# Patient Record
Sex: Female | Born: 1990 | Race: Black or African American | Hispanic: No | Marital: Single | Smoking: Never smoker
Health system: Southern US, Community
[De-identification: ages and names within clinical notes are randomized; demographics above are authoritative.]

## PROBLEM LIST (undated history)

## (undated) DIAGNOSIS — D696 Thrombocytopenia, unspecified: Secondary | ICD-10-CM

## (undated) DIAGNOSIS — O99119 Other diseases of the blood and blood-forming organs and certain disorders involving the immune mechanism complicating pregnancy, unspecified trimester: Secondary | ICD-10-CM

## (undated) DIAGNOSIS — K8020 Calculus of gallbladder without cholecystitis without obstruction: Secondary | ICD-10-CM

## (undated) DIAGNOSIS — Z789 Other specified health status: Secondary | ICD-10-CM

## (undated) HISTORY — DX: Thrombocytopenia, unspecified: O99.119

## (undated) HISTORY — DX: Thrombocytopenia, unspecified: D69.6

## (undated) HISTORY — PX: WISDOM TOOTH EXTRACTION: SHX21

## (undated) HISTORY — PX: INDUCED ABORTION: SHX677

---
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
## 2013-07-29 NOTE — L&D Delivery Note (Signed)
Delivery Note At 7:22 PM a healthy female was delivered via Vaginal, Spontaneous Delivery (Presentation: Left Occiput Anterior).  APGAR: 7, 8; weight .   Placenta status: Intact, Spontaneous.  Cord: 3 vessels with the following complications: None.  Cord pH: na  Anesthesia: None  Episiotomy: None Lacerations: 2nd degree Suture Repair: 3.0 monocyrl Est. Blood Loss (300mL): 200  Upon arrival patient was complete and pushing. She pushed with good maternal effort to deliver a healthy boy. After delivery of the head a tense nuchal cord x 1 was noted. The tension was manually released and baby delivered without difficulty and nuchal cord reduced post delivery. Due to presumed prematurity, NICU was present at delivery. Cord was clamped upon delivery and baby taken to warmer for evaluation, drying and stimulation. Baby received narcan to reverse Fentanyl given to mother prior to delivery. Placenta delivered intact with 3V cord. Pitocin was started and uterus massaged until bleeding slowed. Vaginal canal and perineum was inspected and 2nd degree laceration was found and repaired in the usual fashion by Jacklyn ShellFrances Cresenzo-Dishmon which was hemostatic on completion. Counts of sharps, instruments, and lap pads were all correct.    Mom to postpartum.  Baby to Couplet care / Skin to Skin.  Wenda LowJoyner, James 10/14/2013, 7:52 PM   I was present for delivery and agree with above. CRESENZO-DISHMAN,Brittanee Ghazarian

## 2013-10-14 ENCOUNTER — Encounter (HOSPITAL_COMMUNITY): Payer: Self-pay | Admitting: *Deleted

## 2013-10-14 ENCOUNTER — Inpatient Hospital Stay (HOSPITAL_COMMUNITY): Payer: Medicaid Other

## 2013-10-14 ENCOUNTER — Inpatient Hospital Stay (HOSPITAL_COMMUNITY)
Admission: AD | Admit: 2013-10-14 | Discharge: 2013-10-16 | DRG: 775 | Disposition: A | Discharge status: Home / Self Care | Payer: Medicaid Other | Source: Ambulatory Visit | Attending: Obstetrics & Gynecology | Admitting: Obstetrics & Gynecology

## 2013-10-14 DIAGNOSIS — D689 Coagulation defect, unspecified: Secondary | ICD-10-CM | POA: Diagnosis present

## 2013-10-14 DIAGNOSIS — O9912 Other diseases of the blood and blood-forming organs and certain disorders involving the immune mechanism complicating childbirth: Secondary | ICD-10-CM

## 2013-10-14 DIAGNOSIS — D696 Thrombocytopenia, unspecified: Secondary | ICD-10-CM

## 2013-10-14 DIAGNOSIS — O99892 Other specified diseases and conditions complicating childbirth: Secondary | ICD-10-CM | POA: Diagnosis present

## 2013-10-14 DIAGNOSIS — Z3490 Encounter for supervision of normal pregnancy, unspecified, unspecified trimester: Secondary | ICD-10-CM

## 2013-10-14 DIAGNOSIS — O360990 Maternal care for other rhesus isoimmunization, unspecified trimester, not applicable or unspecified: Secondary | ICD-10-CM | POA: Diagnosis present

## 2013-10-14 DIAGNOSIS — O9989 Other specified diseases and conditions complicating pregnancy, childbirth and the puerperium: Secondary | ICD-10-CM

## 2013-10-14 DIAGNOSIS — Z22330 Carrier of Group B streptococcus: Secondary | ICD-10-CM

## 2013-10-14 HISTORY — DX: Other specified health status: Z78.9

## 2013-10-14 LAB — URINE MICROSCOPIC-ADD ON

## 2013-10-14 LAB — CBC
HCT: 42.1 % (ref 36.0–46.0)
HCT: 40.1 % (ref 36.0–46.0)
Hemoglobin: 14.3 g/dL (ref 12.0–15.0)
Hemoglobin: 13.4 g/dL (ref 12.0–15.0)
MCH: 28.5 pg (ref 26.0–34.0)
MCH: 28 pg (ref 26.0–34.0)
MCHC: 34 g/dL (ref 30.0–36.0)
MCHC: 33.4 g/dL (ref 30.0–36.0)
MCV: 83.9 fL (ref 78.0–100.0)
MCV: 83.7 fL (ref 78.0–100.0)
Platelets: 78 10*3/uL — ABNORMAL LOW (ref 150–400)
Platelets: 68 10*3/uL — ABNORMAL LOW (ref 150–400)
RBC: 5.02 MIL/uL (ref 3.87–5.11)
RBC: 4.79 MIL/uL (ref 3.87–5.11)
RDW: 14.6 % (ref 11.5–15.5)
RDW: 14.5 % (ref 11.5–15.5)
WBC: 8.2 10*3/uL (ref 4.0–10.5)
WBC: 10.9 10*3/uL — ABNORMAL HIGH (ref 4.0–10.5)

## 2013-10-14 LAB — OB RESULTS CONSOLE GBS: STREP GROUP B AG: POSITIVE

## 2013-10-14 LAB — RAPID URINE DRUG SCREEN, HOSP PERFORMED
Amphetamines: NOT DETECTED
Barbiturates: NOT DETECTED
Benzodiazepines: NOT DETECTED
COCAINE: NOT DETECTED
Opiates: NOT DETECTED
TETRAHYDROCANNABINOL: NOT DETECTED

## 2013-10-14 LAB — URINALYSIS, ROUTINE W REFLEX MICROSCOPIC
Bilirubin Urine: NEGATIVE
GLUCOSE, UA: 250 mg/dL — AB
HGB URINE DIPSTICK: NEGATIVE
Ketones, ur: NEGATIVE mg/dL
LEUKOCYTES UA: NEGATIVE
Nitrite: NEGATIVE
PH: 6 (ref 5.0–8.0)
Protein, ur: 30 mg/dL — AB
Specific Gravity, Urine: >1.03 — ABNORMAL HIGH (ref 1.005–1.030)
Urobilinogen, UA: 1 mg/dL (ref 0.0–1.0)

## 2013-10-14 LAB — HEPATITIS B SURFACE ANTIGEN: Hepatitis B Surface Ag: NEGATIVE

## 2013-10-14 LAB — ABO/RH: ABO/RH(D): B NEG

## 2013-10-14 LAB — TYPE AND SCREEN
ABO/RH(D): B NEG
ANTIBODY SCREEN: NEGATIVE

## 2013-10-14 LAB — RPR: RPR: NONREACTIVE

## 2013-10-14 LAB — HEMOGLOBIN A1C
HEMOGLOBIN A1C: 5.6 % (ref <5.7)
Mean Plasma Glucose: 114 mg/dL (ref <117)

## 2013-10-14 LAB — GLUCOSE, CAPILLARY: GLUCOSE-CAPILLARY: 95 mg/dL (ref 70–99)

## 2013-10-14 LAB — OB RESULTS CONSOLE HIV ANTIBODY (ROUTINE TESTING): HIV: NONREACTIVE

## 2013-10-14 LAB — GROUP B STREP BY PCR: GROUP B STREP BY PCR: POSITIVE — AB

## 2013-10-14 LAB — RAPID HIV SCREEN (WH-MAU): Rapid HIV Screen: NONREACTIVE

## 2013-10-14 MED ORDER — EPHEDRINE 5 MG/ML INJ
10.0000 mg | INTRAVENOUS | Status: DC | PRN
  Filled 2013-10-14: qty 2

## 2013-10-14 MED ORDER — OXYCODONE-ACETAMINOPHEN 5-325 MG PO TABS
1.0000 | ORAL_TABLET | ORAL | Status: DC | PRN
  Administered 2013-10-15 (×3): 1 via ORAL
  Administered 2013-10-16: 2 via ORAL
  Filled 2013-10-14 (×6): qty 1

## 2013-10-14 MED ORDER — PHENYLEPHRINE 40 MCG/ML (10ML) SYRINGE FOR IV PUSH (FOR BLOOD PRESSURE SUPPORT)
80.0000 ug | PREFILLED_SYRINGE | INTRAVENOUS | Status: DC | PRN
  Filled 2013-10-14: qty 2

## 2013-10-14 MED ORDER — IBUPROFEN 600 MG PO TABS: 600.0000 mg | ORAL_TABLET | Freq: Four times a day (QID) | ORAL | Status: DC

## 2013-10-14 MED ORDER — DIPHENHYDRAMINE HCL 25 MG PO CAPS: 25.0000 mg | ORAL_CAPSULE | Freq: Four times a day (QID) | ORAL | Status: DC | PRN

## 2013-10-14 MED ORDER — CITRIC ACID-SODIUM CITRATE 334-500 MG/5ML PO SOLN: 30.0000 mL | ORAL | Status: DC | PRN

## 2013-10-14 MED ORDER — TETANUS-DIPHTH-ACELL PERTUSSIS 5-2.5-18.5 LF-MCG/0.5 IM SUSP
0.5000 mL | Freq: Once | INTRAMUSCULAR | Status: AC
  Administered 2013-10-16: 0.5 mL via INTRAMUSCULAR
  Filled 2013-10-14: qty 0.5

## 2013-10-14 MED ORDER — PRENATAL MULTIVITAMIN CH
1.0000 | ORAL_TABLET | Freq: Every day | ORAL | Status: DC
Start: 2013-10-15 — End: 2013-10-16
  Administered 2013-10-15 – 2013-10-16 (×2): 1 via ORAL
  Filled 2013-10-14 (×2): qty 1

## 2013-10-14 MED ORDER — ZOLPIDEM TARTRATE 5 MG PO TABS
5.0000 mg | ORAL_TABLET | Freq: Every evening | ORAL | Status: DC | PRN
Start: 2013-10-14 — End: 2013-10-16

## 2013-10-14 MED ORDER — LACTATED RINGERS IV SOLN: 500.0000 mL | Freq: Once | INTRAVENOUS | Status: DC

## 2013-10-14 MED ORDER — SENNOSIDES-DOCUSATE SODIUM 8.6-50 MG PO TABS
2.0000 | ORAL_TABLET | ORAL | Status: DC
  Administered 2013-10-15 – 2013-10-16 (×2): 2 via ORAL
  Filled 2013-10-14 (×2): qty 2

## 2013-10-14 MED ORDER — NALOXONE HCL 0.4 MG/ML IJ SOLN
INTRAMUSCULAR | Status: AC
  Administered 2013-10-14: 0.4 mg
  Filled 2013-10-14: qty 1

## 2013-10-14 MED ORDER — OXYCODONE-ACETAMINOPHEN 5-325 MG PO TABS: 1.0000 | ORAL_TABLET | ORAL | Status: DC | PRN

## 2013-10-14 MED ORDER — PHENYLEPHRINE 40 MCG/ML (10ML) SYRINGE FOR IV PUSH (FOR BLOOD PRESSURE SUPPORT)
80.0000 ug | PREFILLED_SYRINGE | INTRAVENOUS | Status: DC | PRN
  Filled 2013-10-14: qty 10
  Filled 2013-10-14: qty 2

## 2013-10-14 MED ORDER — SODIUM CHLORIDE 0.9 % IV SOLN: 2.0000 g | Freq: Four times a day (QID) | INTRAVENOUS | Status: DC

## 2013-10-14 MED ORDER — DIBUCAINE 1 % RE OINT
1.0000 | TOPICAL_OINTMENT | RECTAL | Status: DC | PRN
Start: 2013-10-14 — End: 2013-10-16

## 2013-10-14 MED ORDER — EPHEDRINE 5 MG/ML INJ
10.0000 mg | INTRAVENOUS | Status: DC | PRN
  Filled 2013-10-14: qty 4
  Filled 2013-10-14: qty 2

## 2013-10-14 MED ORDER — FENTANYL 2.5 MCG/ML BUPIVACAINE 1/10 % EPIDURAL INFUSION (WH - ANES)
14.0000 mL/h | INTRAMUSCULAR | Status: DC | PRN
  Filled 2013-10-14: qty 125

## 2013-10-14 MED ORDER — LACTATED RINGERS IV SOLN
INTRAVENOUS | Status: DC
  Administered 2013-10-14: 14:00:00 via INTRAVENOUS

## 2013-10-14 MED ORDER — ACETAMINOPHEN 325 MG PO TABS: 650.0000 mg | ORAL_TABLET | ORAL | Status: DC | PRN

## 2013-10-14 MED ORDER — DIPHENHYDRAMINE HCL 50 MG/ML IJ SOLN: 12.5000 mg | INTRAMUSCULAR | Status: DC | PRN

## 2013-10-14 MED ORDER — ONDANSETRON HCL 4 MG/2ML IJ SOLN
4.0000 mg | Freq: Four times a day (QID) | INTRAMUSCULAR | Status: DC | PRN
  Administered 2013-10-14: 4 mg via INTRAVENOUS
  Filled 2013-10-14: qty 2

## 2013-10-14 MED ORDER — FENTANYL CITRATE 0.05 MG/ML IJ SOLN
INTRAMUSCULAR | Status: AC
  Administered 2013-10-14: 100 ug via INTRAVENOUS
  Filled 2013-10-14: qty 2

## 2013-10-14 MED ORDER — ONDANSETRON HCL 4 MG/2ML IJ SOLN: 4.0000 mg | INTRAMUSCULAR | Status: DC | PRN

## 2013-10-14 MED ORDER — BENZOCAINE-MENTHOL 20-0.5 % EX AERO
1.0000 "application " | INHALATION_SPRAY | CUTANEOUS | Status: DC | PRN
  Administered 2013-10-15: 1 via TOPICAL
  Filled 2013-10-14: qty 56

## 2013-10-14 MED ORDER — LANOLIN HYDROUS EX OINT: TOPICAL_OINTMENT | CUTANEOUS | Status: DC | PRN

## 2013-10-14 MED ORDER — OXYTOCIN BOLUS FROM INFUSION: 500.0000 mL | INTRAVENOUS | Status: DC

## 2013-10-14 MED ORDER — WITCH HAZEL-GLYCERIN EX PADS: 1.0000 "application " | MEDICATED_PAD | CUTANEOUS | Status: DC | PRN

## 2013-10-14 MED ORDER — FENTANYL CITRATE 0.05 MG/ML IJ SOLN
100.0000 ug | INTRAMUSCULAR | Status: DC | PRN
Start: 2013-10-14 — End: 2013-10-14
  Administered 2013-10-14 (×3): 100 ug via INTRAVENOUS
  Filled 2013-10-14 (×2): qty 2

## 2013-10-14 MED ORDER — ONDANSETRON HCL 4 MG PO TABS: 4.0000 mg | ORAL_TABLET | ORAL | Status: DC | PRN

## 2013-10-14 MED ORDER — FLEET ENEMA 7-19 GM/118ML RE ENEM: 1.0000 | ENEMA | RECTAL | Status: DC | PRN

## 2013-10-14 MED ORDER — IBUPROFEN 600 MG PO TABS: 600.0000 mg | ORAL_TABLET | Freq: Four times a day (QID) | ORAL | Status: DC | PRN

## 2013-10-14 MED ORDER — SODIUM CHLORIDE 0.9 % IV SOLN
2.0000 g | Freq: Four times a day (QID) | INTRAVENOUS | Status: DC
  Administered 2013-10-14: 2 g via INTRAVENOUS
  Filled 2013-10-14 (×2): qty 2000

## 2013-10-14 MED ORDER — SIMETHICONE 80 MG PO CHEW
80.0000 mg | CHEWABLE_TABLET | ORAL | Status: DC | PRN
Start: 2013-10-14 — End: 2013-10-16

## 2013-10-14 MED ORDER — OXYTOCIN 40 UNITS IN LACTATED RINGERS INFUSION - SIMPLE MED
62.5000 mL/h | INTRAVENOUS | Status: DC
  Filled 2013-10-14: qty 1000

## 2013-10-14 MED ORDER — LACTATED RINGERS IV SOLN: 500.0000 mL | INTRAVENOUS | Status: DC | PRN

## 2013-10-14 MED ORDER — LIDOCAINE HCL (PF) 1 % IJ SOLN
30.0000 mL | INTRAMUSCULAR | Status: DC | PRN
  Administered 2013-10-14: 30 mL via SUBCUTANEOUS
  Filled 2013-10-14: qty 30

## 2013-10-14 NOTE — Consult Note (Signed)
Neonatology Note:  Attendance at Delivery:  I was asked by Frances Cresenzo-Dishmon for Dr. Arnold to attend this NSVD at 34-[redacted] weeks GA by ultrasound today and no PNC. The mother is a G2PoA1 B neg, GBS pos with thrombocytopenia of unknown etiology (platelet count 68-78K today). She had not received Rhogam. She got Ampicillin 3 hours before delivery and was afebrile. ROM 45 min prior to delivery, fluid clear. I bulb suctioned the baby while the cord was being clamped and he coughed and cried once, then became quiet again. He was placed on the radiant warmer at about 1 min of life and had slightly decreased tone and reflexes, dusky color, HR 120. We bulb suctioned again and gave stimulation. He cried several times and began to pink up. At about 2 min of life, he became apneic for about 20 seconds, maintaining his HR. We gave stimulation again and obtained a history of Fentanyl administration to his mother during labor. I asked his mother about any illicit drug use during pregnancy, which she denied. I gave the baby 1 ml Narcan to the right anterior thigh at about 6-7 min of age, after which he became much more vigorous, pinking up well, crying intermittently, and generally looking more vigorous. Ap 7/8/9. Lungs clear to ausc in DR. Baby appears 37-[redacted] weeks GA by exam, so may go to CN to care of Pediatrician.  Karen Ormond C. Marciana Uplinger, MD  

## 2013-10-14 NOTE — MAU Note (Signed)
Pt reports she has started having ctx this morning. No bleeding or leaking reported.

## 2013-10-14 NOTE — Progress Notes (Signed)
   Karen [name] is a 23 y.o. G2P0010 at 4276w2d  admitted for active labor  Subjective: Doing pretty good with epidural  Objective: BP 135/66  Pulse 126  Temp(Src) 98.9 F (37.2 C) (Oral)  Resp 18  Ht 5\' 4"  (1.626 m)  Wt 157 lb (71.215 kg)  BMI 26.94 kg/m2    FHT:  FHR: 150 bpm, variability: moderate,  accelerations:  Present,  decelerations:  Absent UC:   regular, every 2-3 minutes SVE:   Dilation: Lip/rim Effacement (%): 100 Station: 0;+1 Exam by:: creszeno-dishmon, cnm Forebag ruptured with clear fluid  Labs: Lab Results  Component Value Date   WBC 10.9* 10/14/2013   HGB 13.4 10/14/2013   HCT 40.1 10/14/2013   MCV 83.7 10/14/2013   PLT 68* 10/14/2013    Assessment / Plan: Spontaneous labor, progressing normally Thrombocytopenia of unknown etiology Labor: Progressing normally Fetal Wellbeing:  Category I Pain Control:  Fentanyl Anticipated MOD:  NSVD  CRESENZO-DISHMAN,Marykathleen Russi 10/14/2013, 6:26 PM

## 2013-10-14 NOTE — H&P (Signed)
LABOR ADMISSION HISTORY AND PHYSICAL   Karen Glassingaris S Tamm is a 23 y.o. female G2P0010 with IUP at 4062w2d by US today presenting for active labor. States contractions started this AM and have progressively worsened.  No LOF, vb. +FM.   Pt has had no PNC. Was seen at the HD for a confirmation of pregnancy test but never had any visits. States did not have medicaid.    Prenatal History/Complications:  Past Medical History: Past Medical History  Diagnosis Date  . Medical history non-contributory     Past Surgical History: Past Surgical History  Procedure Laterality Date  . Wisdom tooth extraction      Obstetrical History: OB History   Grav Para Term Preterm Abortions TAB SAB Ect Mult Living   2 0 0 0 1 1 0 0 0 0       Social History: History   Social History  . Marital Status: Single    Spouse Name: N/A    Number of Children: N/A  . Years of Education: N/A   Social History Main Topics  . Smoking status: Never Smoker   . Smokeless tobacco: None  . Alcohol Use: No  . Drug Use: No  . Sexual Activity: Yes   Other Topics Concern  . None   Social History Narrative  . None    Family History: History reviewed. No pertinent family history.  Allergies: No Known Allergies  Prescriptions prior to admission  Medication Sig Dispense Refill  . Prenatal Vit-Fe Fumarate-FA (MULTIVITAMIN-PRENATAL) 27-0.8 MG TABS tablet Take 1 tablet by mouth daily at 12 noon.         Review of Systems  All systems reviewed and negative except as stated in HPI    Blood pressure 122/69, pulse 108, temperature 98 F (36.7 C), temperature source Oral, resp. rate 20, height 5\' 4"  (1.626 m), weight 71.215 kg (157 lb). General appearance: alert, cooperative and no distress Lungs: clear to auscultation bilaterally Heart: regular rate and rhythm Abdomen: soft, non-tender; bowel sounds normal Extremities: Homans sign is negative, no sign of DVT DTR's 3+, no clonus Presentation: cephalic Fetal  monitoringBaseline: 145 bpm, Variability: Good {> 6 bpm), Accelerations: Reactive and Decelerations: Absent Uterine activity initially q472min and then irregular after fentanyl    Dilation: 5 Effacement (%): 100 Station: 0 Exam by:: hk   Prenatal labs: ABO, Rh: --/--/B NEG (03/19 1335) Antibody: PENDING (03/19 1335) Rubella:   RPR:    HBsAg:    HIV:    GBS: Positive (03/19 0000)  Anatomy US normal limited anatomy     Assessment: Karen [name] is a 23 y.o. G2P0010 with an IUP at 1362w2d by an US today presenting for active preterm labor.   Plan: 1) Active Preterm labor  - changed cervix from 4-5  - admit to L&D  - routine orders - avoid augmentation   2) FWB - US today with limited anatomy but likely 162w2d.  - NICU notified - GBS pos and will give amp  3) RH neg - no rhogam - will need postpartum if no antibodies  4) thrombocytopenia - unclear etiology  - no epidural currently - will repeat in a few hours  5) anticipate NSVD    Carron Jaggi L, MD 10/14/2013, 4:00 PM

## 2013-10-15 LAB — CBC
HCT: 35 % — ABNORMAL LOW (ref 36.0–46.0)
Hemoglobin: 11.6 g/dL — ABNORMAL LOW (ref 12.0–15.0)
MCH: 28 pg (ref 26.0–34.0)
MCHC: 33.1 g/dL (ref 30.0–36.0)
MCV: 84.3 fL (ref 78.0–100.0)
Platelets: 70 10*3/uL — ABNORMAL LOW (ref 150–400)
RBC: 4.15 MIL/uL (ref 3.87–5.11)
RDW: 14.7 % (ref 11.5–15.5)
WBC: 14 10*3/uL — ABNORMAL HIGH (ref 4.0–10.5)

## 2013-10-15 LAB — RUBELLA SCREEN: Rubella: 4.15 Index — ABNORMAL HIGH (ref <0.90)

## 2013-10-15 LAB — GC/CHLAMYDIA PROBE AMP
CT PROBE, AMP APTIMA: NEGATIVE
GC PROBE AMP APTIMA: NEGATIVE

## 2013-10-15 MED ORDER — RHO D IMMUNE GLOBULIN 1500 UNIT/2ML IJ SOLN
300.0000 ug | Freq: Once | INTRAMUSCULAR | Status: AC
  Administered 2013-10-15: 300 ug via INTRAVENOUS
  Filled 2013-10-15: qty 2

## 2013-10-15 NOTE — Lactation Note (Signed)
This note was copied from the chart of [name]. Lactation Consultation Note Initial consultation; mom states she wants to breast and formula feed baby; states wants to exclusively br feed for now. Mom states br feeding is going ok now, denies pain, states she is still getting used to how to hold baby, states baby latches well, denies pain. Mom states she is using a nipple shield which helps baby's latch. Mom states she fed baby about 30 minutes ago, at this time baby is asleep on his back in the bassinet.  Br feeding basics, baby and me book reviewed with mom, questions answered. Lactation brochure, community resources, BFSG discussed with mom.  Enc mom to call for next feeding for a feed assessment.  Patient Name: [name] ZOXWR'UToday's Date: 10/15/2013 Reason for consult: Initial assessment   Maternal Data Formula Feeding for Exclusion: Yes Reason for exclusion: Mother's choice to formula and breast feed on admission Has patient been taught Hand Expression?: Yes Does the patient have breastfeeding experience prior to this delivery?: No  Feeding    LATCH Score/Interventions                      Lactation Tools Discussed/Used     Consult Status Consult Status: Follow-up    [name], [name] [facility] 10/15/2013, 12:00 PM

## 2013-10-15 NOTE — Progress Notes (Signed)
Post Partum Day 1 Subjective: no complaints, up ad lib, voiding and tolerating PO  Objective: Blood pressure 110/68, pulse 110, temperature 98.1 F (36.7 C), temperature source Oral, resp. rate 18, height 5\' 4"  (1.626 m), weight 71.215 kg (157 lb), SpO2 98.00%, unknown if currently breastfeeding.  Physical Exam:  General: alert and cooperative Lochia: appropriate DVT Evaluation: No evidence of DVT seen on physical exam. Uterine Fundus: firm  Recent Labs  10/14/13 1722 10/15/13 0628  HGB 13.4 11.6*  HCT 40.1 35.0*    Assessment/Plan: Plan for discharge tomorrow. Lab values reveal that the patient is still having stable thrombocytopenia in the 70's, however she remains asymptomatic. Monitor and repeat lab values in the AM.   LOS: 1 day   Unknown FoleyWinkel, Bradley T 10/15/2013, 8:02 AM   I have seen and examined this patient and agree with above documentation in the PA student's note.   Rulon AbideKeli Godson Pollan, M.D. [facility] 10/15/2013 11:31 AM

## 2013-10-15 NOTE — Clinical Social Work Maternal (Signed)
Clinical Social Work Department  PSYCHOSOCIAL ASSESSMENT - MATERNAL/CHILD  10/15/2013  Patient: [name],[name] Account Number: [id number] Admit Date: 10/14/2013  Childs Name:  Karen [name]   Clinical Social Worker: Karen Balan, LCSW Date/Time: 10/15/2013 12:40 PM  Date Referred: 10/15/2013  Referral source   CN    Referred reason   Other - See comment   Other referral source:  I: FAMILY / HOME ENVIRONMENT  Child's legal guardian: PARENT  Guardian - Name  Guardian - Age  Guardian - Address   Karen [name]  22  [address] [place], [place] [address]   Karen [name]  27    Other household support members/support persons  Name  Relationship  DOB   Karen [name]  FATHER     SISTER  16 years old   Other support:  II PSYCHOSOCIAL DATA  Information Source: Patient Interview  Financial and Community Resources  Employment:  Financial resources: Self Pay  If Medicaid - County:  School / Grade:  Maternity Care Coordinator / Child Services Coordination / Early Interventions: Cultural issues impacting care:  III STRENGTHS  Strengths   Adequate Resources   Home prepared for Child (including basic supplies)   Supportive family/friends   Strength comment:  IV RISK FACTORS AND CURRENT PROBLEMS  Current Problem: YES  Risk Factor & Current Problem  Patient Issue  Family Issue  Risk Factor / Current Problem Comment   Other - See comment  Y  N  NPNC   V SOCIAL WORK ASSESSMENT  CSW referral received to assess pts reason for NPNC. Pt told CSW that she [facility] at [facility] at 6 months however PN records not on file. Since pt told CSW that she started PNC at 6 months, CSW inquired about the reason for LPNC. Pt initially was not able to verbalized a reason but then stated " I was in denial." She seems to be bonding well with the infant now & committed to parenting. CSW explained hospital drug testing policy & pt verbalized an understanding. UDS is negative, meconium results are pending.  Pt is not employed & does not receive any services from department of social services. CSW inquired how pt would provide financial support for infant. FOB spoke up & said " I will help out a lot with the baby." CSW encouraged pt to apply for WIC & Food Stamps. CSW provided pt with a Food Stamp applications as a resource. Pt has all the necessary supplies for the infant. FOB is present & identified himself as a primary support person. Pt lives with her grandfather & teenage sibling. Her support system appears limited. Pt was observed providing appropriate care for the infant & attending to infant cues appropriately. CSW discussed PP depression signs/symptoms with pt & encouraged her to seek medical attention if necessary. CSW will continue to monitor drug screen results & make a referral if needed.   VI SOCIAL WORK PLAN  Social Work Plan   No Further Intervention Required / No Barriers to Discharge   Type of pt/family education:  If child protective services report - county:  If child protective services report - date:  Information/referral to community resources comment:  Other social work plan:       Clinical Social Work Department PSYCHOSOCIAL ASSESSMENT - MATERNAL/CHILD 10/15/2013  Patient:  Karen [name], Karen [name]  Account Number:  [id number]  Admit Date:  10/14/2013  Karen [name] Name:   Karen [name]    Clinical Social Worker:  Karen Putnam, LCSW   Date/Time:  10/15/2013 12:40 PM  Date Referred:  10/15/2013   Referral source  CN     Referred reason  Other - See comment   Other referral source:    I:  FAMILY / HOME ENVIRONMENT Child's legal guardian:  PARENT  Guardian - Name Guardian - Age Guardian - Address  Karen [name] 22 [address]; [place], [place] [address]  Karen [name] 27    Other household support members/support persons Name Relationship DOB  Karen [name] FATHER    SISTER 60 years old   Other support:    II  PSYCHOSOCIAL DATA Information Source:  Patient Interview  Surveyor, quantity and Community Resources Employment:   Financial resources:  Self Pay If [employer] - County:    School / Grade:   Maternity Care Coordinator / Child Services Coordination / Early Interventions:  Cultural issues impacting care:    III  STRENGTHS Strengths  Adequate Resources  Home prepared for Child (including basic supplies)  Supportive family/friends   Strength comment:    IV  RISK FACTORS AND CURRENT PROBLEMS Current Problem:  YES   Risk Factor & Current Problem Patient Issue Family Issue Risk Factor / Current Problem Comment  Other - See comment Y N NPNC    V  SOCIAL WORK ASSESSMENT CSW referral received to assess pts reason for [facility].  Pt told CSW that she started [facility] at [facility] at 6 months however PN records not on file.  Since pt told CSW that she started [facility] at 6 months, CSW inquired about the reason for [facility].   Pt initially was not able to verbalized a reason but then stated " I was in denial."  She seems to be bonding well with the infant now & committed to parenting. CSW explained hospital drug testing policy & pt verbalized an understanding.  UDS is  negative, meconium results are pending.  Pt is not employed & does not receive any services from department of social services.  CSW inquired how pt would provide financial support for infant.  FOB spoke up & said " I will help out a lot with the baby." CSW encouraged pt to apply for [facility] & Food Stamps.  CSW provided pt with a Food Stamp applications as a resource. Pt has all the necessary supplies for the infant.  FOB is present & identified himself as a primary support person. Pt lives with her grandfather & teenage sibling.  Her support system appears limited.  Pt was observed providing appropriate care for the infant & attending to infant cues appropriately.  CSW discussed PP depression signs/symptoms with pt & encouraged her to seek medical attention if necessary.  CSW will continue to monitor drug screen results & make a referral if needed.      VI SOCIAL WORK PLAN Social Work Plan  No Further Intervention Required / No Barriers to Discharge   Type of pt/family education:   If child protective services report - county:   If child protective services report - date:   Information/referral to community resources comment:   Other social work plan:

## 2013-10-16 LAB — RH IG WORKUP (INCLUDES ABO/RH)
ABO/RH(D): B NEG
FETAL SCREEN: NEGATIVE
Gestational Age(Wks): 38
Unit division: 0

## 2013-10-16 MED ORDER — IBUPROFEN 200 MG PO TABS: 600.0000 mg | ORAL_TABLET | Freq: Four times a day (QID) | ORAL | Status: DC | PRN

## 2013-10-16 NOTE — Discharge Instructions (Signed)

## 2013-10-16 NOTE — Discharge Summary (Signed)
Obstetric Discharge Summary Reason for Admission: onset of labor Prenatal Procedures: none Intrapartum Procedures: spontaneous vaginal delivery and GBS prophylaxis Postpartum Procedures: none Complications-Operative and Postpartum: 2nd degree perineal laceration Hemoglobin  Date Value Ref Range Status  10/15/2013 11.6* 12.0 - 15.0 g/dL Final     HCT  Date Value Ref Range Status  10/15/2013 35.0* 36.0 - 46.0 % Final   Hospital Course Karen [name] DoctorS Jarnagin is a 23 y.o. G2P0010 who presented to the MAU in active labor at 3839w2d, by third trimester ultrasound on admission.  She received no PNC except for a confirmation of pregnancy test at HD. Pt states that she didn't seek care because she didn't have medicaid. One dose of ampicillin was given for positive GBS status. The patient delivered a healthy female via NSVD with apgars of  7 and 8. The infant had a nuchal cord that was easily reduced after delivery. The infant was treated as premature based on the admission ultrasound, but further assessment dated infant to 37-38 weeks.  Baby received narcan to reverse Fentanyl given to mother prior to delivery. Mother had a 2nd degree laceration which was repaired. Postpartum course has been uncomplicated.  Mother is bottle feeding and plans to get Nexplanon.    Physical Exam:  General: alert, cooperative and no distress Lochia: appropriate Uterine Fundus: firm Incision: N/A DVT Evaluation: No evidence of DVT seen on physical exam. Negative Homan's sign.  Discharge Diagnoses: Term Pregnancy-delivered  Discharge Information: Date: 10/16/2013 Activity: unrestricted Diet: routine Medications: PNV, Ibuprofen and Percocet Condition: stable Instructions: refer to practice specific booklet Discharge to: home   Newborn Data: Live born female  Birth Weight: 7 lb 12.5 oz (3530 g) APGAR: 7, 8  Home with mother.  Selena LesserBraimah, Tina 10/16/2013, 6:48 AM  I have seen and examined this patient and I agree with the  above. Cam HaiSHAW, Aleyda Gindlesperger CNM 8:34 AM 10/16/2013

## 2013-10-17 NOTE — Discharge Summary (Signed)
`````  Attestation of Attending Supervision of Advanced Practitioner: Evaluation and management procedures were performed by the PA/NP/CNM/OB Fellow under my supervision/collaboration. Chart reviewed and agree with management and plan.  Temperance Kelemen V 10/17/2013 11:11 PM

## 2013-10-19 NOTE — Progress Notes (Signed)
Post discharge chart review completed.  

## 2013-12-21 ENCOUNTER — Ambulatory Visit: Payer: Self-pay | Admitting: Obstetrics

## 2013-12-30 ENCOUNTER — Ambulatory Visit (INDEPENDENT_AMBULATORY_CARE_PROVIDER_SITE_OTHER): Payer: Medicaid Other | Admitting: Obstetrics

## 2013-12-30 ENCOUNTER — Encounter: Payer: Self-pay | Admitting: Obstetrics

## 2013-12-30 DIAGNOSIS — Z3009 Encounter for other general counseling and advice on contraception: Secondary | ICD-10-CM

## 2013-12-30 MED ORDER — MEDROXYPROGESTERONE ACETATE 150 MG/ML IM SUSP: 150.0000 mg | INTRAMUSCULAR | Status: DC

## 2013-12-30 NOTE — Progress Notes (Signed)
Subjective:     Karen [name] is a 23 y.o. female who presents for a postpartum visit. She is 6 weeks postpartum following a spontaneous vaginal delivery. I have fully reviewed the prenatal and intrapartum course. The delivery was at term gestational weeks. Outcome: spontaneous vaginal delivery. Anesthesia: IV sedation. Postpartum course has been normal. Baby's course has been normal. Baby is feeding by breast. Bleeding no bleeding. Bowel function is normal. Bladder function is normal. Patient is sexually active. Contraception method is Depo-Provera injections. Postpartum depression screening: negative.  The following portions of the patient's history were reviewed and updated as appropriate: allergies, current medications, past family history, past medical history, past social history, past surgical history and problem list.  Review of Systems A comprehensive review of systems was negative.   Objective:    BP 128/82  Pulse 90  Temp(Src) 97.7 F (36.5 C)  Ht 5\' 5"  (1.651 m)  Wt 131 lb (59.421 kg)  BMI 21.80 kg/m2  Breastfeeding? Yes  General:  alert and no distress   Breasts:  inspection negative, no nipple discharge or bleeding, no masses or nodularity palpable  Lungs: clear to auscultation bilaterally  Heart:  regular rate and rhythm, S1, S2 normal, no murmur, click, rub or gallop  Abdomen: normal findings: soft, non-tender   Vulva:  normal  Vagina: normal vagina  Cervix:  no lesions  Corpus: normal size, contour, position, consistency, mobility, non-tender  Adnexa:  no mass, fullness, tenderness  Rectal Exam: Not performed.          Assessment:     Normal postpartum exam. Pap smear not done at today's visit.   Plan:    1. Contraception: Depo-Provera injections 2. Depo Provera Rx. 3. Follow up in: 1 year or as needed.

## 2014-01-06 ENCOUNTER — Ambulatory Visit (INDEPENDENT_AMBULATORY_CARE_PROVIDER_SITE_OTHER): Payer: Medicaid Other | Admitting: *Deleted

## 2014-01-06 VITALS — BP 112/66 | HR 71 | Temp 99.0°F | Ht 65.0 in | Wt 131.0 lb

## 2014-01-06 DIAGNOSIS — IMO0001 Reserved for inherently not codable concepts without codable children: Secondary | ICD-10-CM

## 2014-01-06 DIAGNOSIS — Z309 Encounter for contraceptive management, unspecified: Secondary | ICD-10-CM

## 2014-01-06 LAB — POCT URINE PREGNANCY: Preg Test, Ur: NEGATIVE

## 2014-01-06 MED ORDER — MEDROXYPROGESTERONE ACETATE 150 MG/ML IM SUSP
150.0000 mg | INTRAMUSCULAR | Status: AC
  Administered 2014-01-06 – 2014-09-22 (×4): 150 mg via INTRAMUSCULAR

## 2014-01-06 NOTE — Progress Notes (Signed)
Patient in office for a Depo injection.  Patient states she is sexually active and her last intercourse was about 2 weeks ago. Per Dr. Clearance Coots okay to give with a negative pregnancy test. Pregnancy test performed in office. Results were negative.  Patient given injection and tolerated well. Patient to return to office for next injection 03-30-14.   BP 112/66  Pulse 71  Temp(Src) 99 F (37.2 C)  Ht 5\' 5"  (1.651 m)  Wt 131 lb (59.421 kg)  BMI 21.80 kg/m2  Breastfeeding? Yes  Administrations This Visit   medroxyPROGESTERone (DEPO-PROVERA) injection 150 mg   Administered Action Dose Route Administered By   01/06/2014 Given 150 mg Intramuscular Shelda Pal, LPN

## 2014-03-25 ENCOUNTER — Ambulatory Visit: Payer: Medicaid Other | Admitting: Obstetrics

## 2014-03-30 ENCOUNTER — Ambulatory Visit (INDEPENDENT_AMBULATORY_CARE_PROVIDER_SITE_OTHER): Payer: Medicaid Other | Admitting: *Deleted

## 2014-03-30 ENCOUNTER — Ambulatory Visit: Payer: Medicaid Other

## 2014-03-30 VITALS — BP 125/75 | HR 96 | Temp 98.7°F | Wt 124.0 lb

## 2014-03-30 DIAGNOSIS — Z3049 Encounter for surveillance of other contraceptives: Secondary | ICD-10-CM

## 2014-03-30 DIAGNOSIS — Z3042 Encounter for surveillance of injectable contraceptive: Secondary | ICD-10-CM

## 2014-03-30 NOTE — Progress Notes (Signed)
Patient is in the office today for her DEPO Injection. Patient is on time for her Injection. Injection given in Left Deltoid. Patient tolerated well. Patient denies any concerns today. Patient notified to make an appointment with the front for June 21, 2014 for her next injection. Patient voiced understanding.   BP 125/75  Pulse 96  Temp(Src) 98.7 F (37.1 C)  Wt 124 lb (56.246 kg)  LMP 03/03/2014  Breastfeeding? Yes  Administrations This Visit   medroxyPROGESTERone (DEPO-PROVERA) injection 150 mg   Administered Action Dose Route Administered By   03/30/2014 Given 150 mg Intramuscular Odessa Fleming, LPN

## 2014-06-21 ENCOUNTER — Ambulatory Visit: Payer: Medicaid Other

## 2014-06-29 ENCOUNTER — Ambulatory Visit (INDEPENDENT_AMBULATORY_CARE_PROVIDER_SITE_OTHER): Payer: Medicaid Other | Admitting: *Deleted

## 2014-06-29 VITALS — BP 118/77 | HR 105 | Temp 97.8°F | Wt 130.0 lb

## 2014-06-29 DIAGNOSIS — Z3042 Encounter for surveillance of injectable contraceptive: Secondary | ICD-10-CM

## 2014-06-29 NOTE — Progress Notes (Signed)
Pt is in office today for Depo injection.  Pt is on time for injection.  Pt tolerated injection well.  Pt advised to schedule appt for next injection anytime between 09/14/14-09/29/14.    BP 118/77 mmHg  Pulse 105  Temp(Src) 97.8 F (36.6 C)  Wt 130 lb (58.968 kg)  Administrations This Visit    medroxyPROGESTERone (DEPO-PROVERA) injection 150 mg    Administered Action Dose Route Administered By         06/29/2014 Given 150 mg Intramuscular Lanney GinsSuzanne D Foster, CMA

## 2014-09-14 ENCOUNTER — Ambulatory Visit: Payer: Medicaid Other

## 2014-09-16 ENCOUNTER — Ambulatory Visit: Payer: Medicaid Other

## 2014-09-22 ENCOUNTER — Ambulatory Visit: Payer: Medicaid Other | Admitting: *Deleted

## 2014-09-22 VITALS — BP 129/79 | HR 93 | Temp 99.0°F | Ht 64.0 in | Wt 129.0 lb

## 2014-09-22 DIAGNOSIS — Z3042 Encounter for surveillance of injectable contraceptive: Secondary | ICD-10-CM

## 2014-09-22 NOTE — Progress Notes (Signed)
Patient in office today for a Depo Injection. Patient is on time for her injection.  Patient tolerated injection well.  Patient due for next injection on 12-15-14. Patient is also due for an annual exam. Patient advised to schedule that appointment on her way out.   BP 129/79 mmHg  Pulse 93  Temp(Src) 99 F (37.2 C)  Ht 5\' 4"  (1.626 m)  Wt 129 lb (58.514 kg)  BMI 22.13 kg/m2  Breastfeeding? Yes   Administrations This Visit    medroxyPROGESTERone (DEPO-PROVERA) injection 150 mg    Admin Date Action Dose Route Administered By         09/22/2014 Given 150 mg Intramuscular Shelda PalAndrea Lynn Jaleiyah Alas, LPN

## 2014-12-15 ENCOUNTER — Ambulatory Visit: Payer: Medicaid Other

## 2014-12-18 IMAGING — US US OB COMP +14 WK
1 series · 12 of 22 positions shown · non-contrast
Comparison: none

[Series 1: us ob comp +14 wk · 12 of 22 slices shown]
[im 1/22]
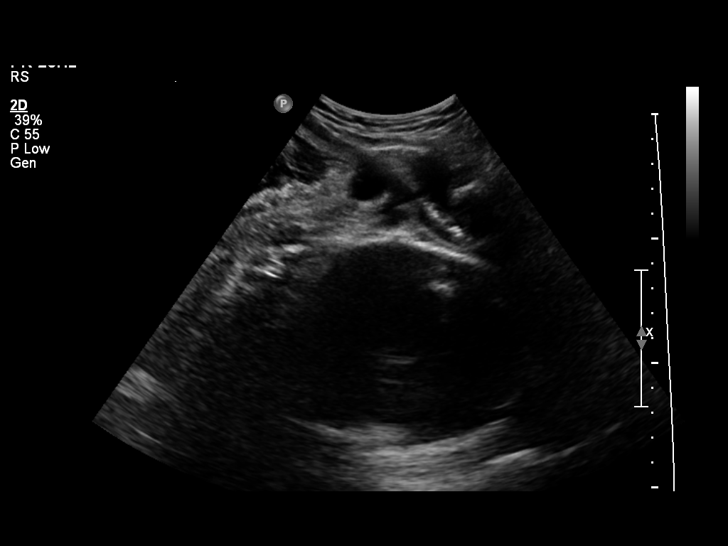
[im 3/22]
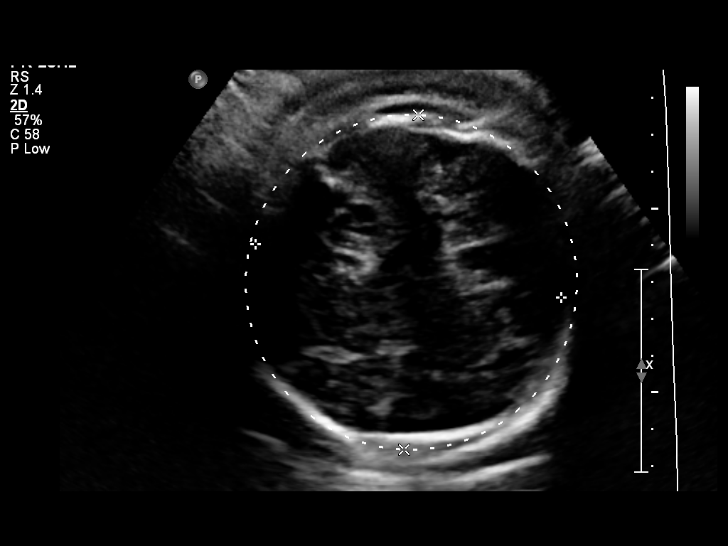
[im 5/22]
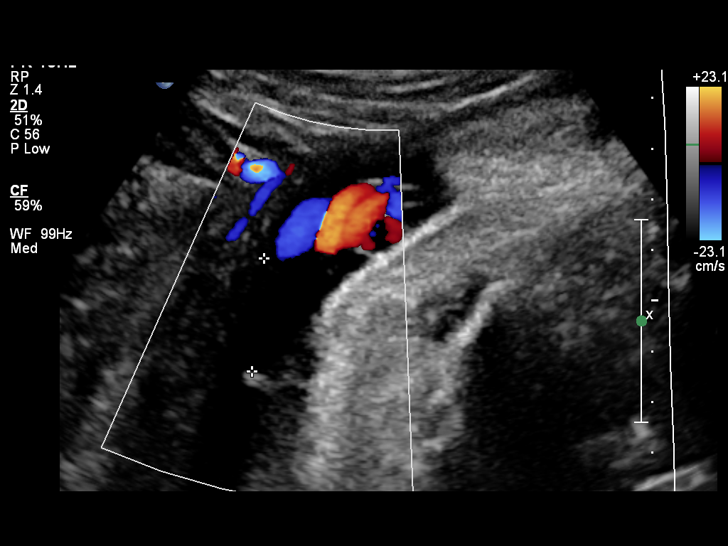
[im 7/22]
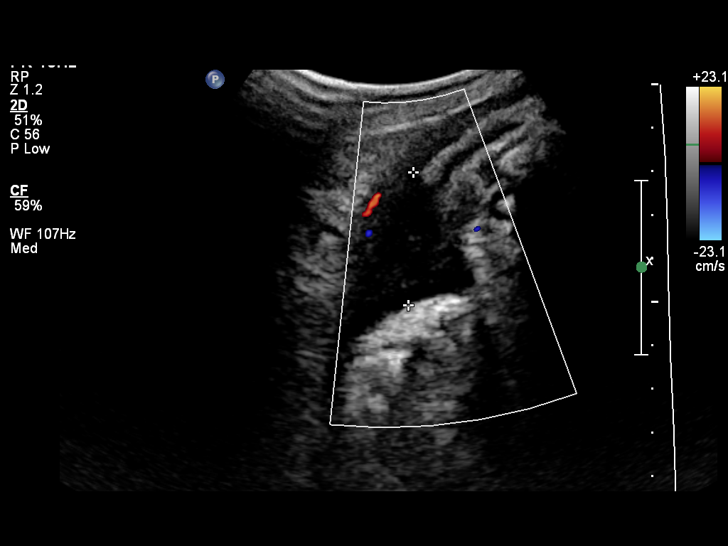
[im 9/22]
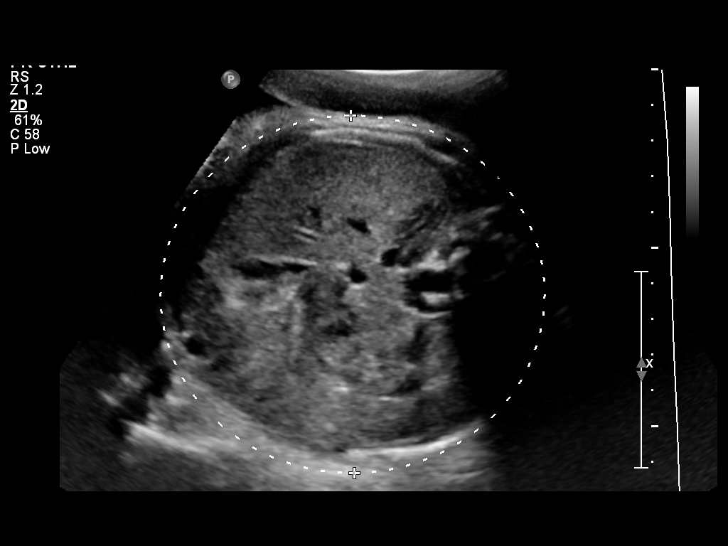
[im 11/22]
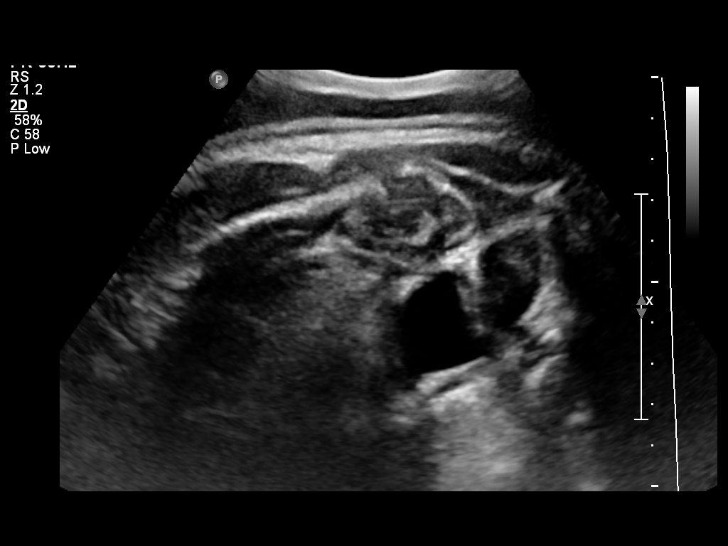
[im 12/22]
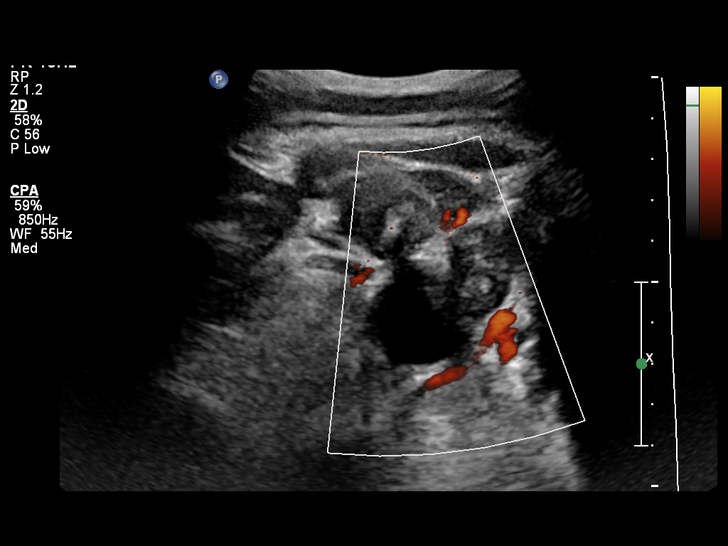
[im 14/22]
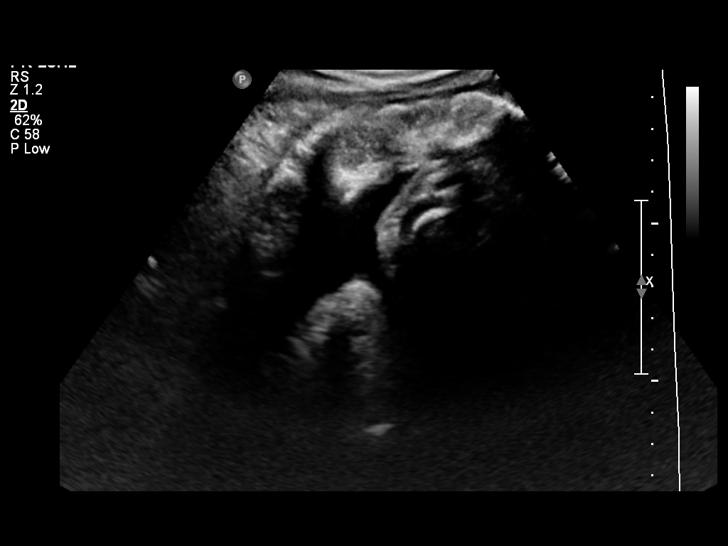
[im 16/22]
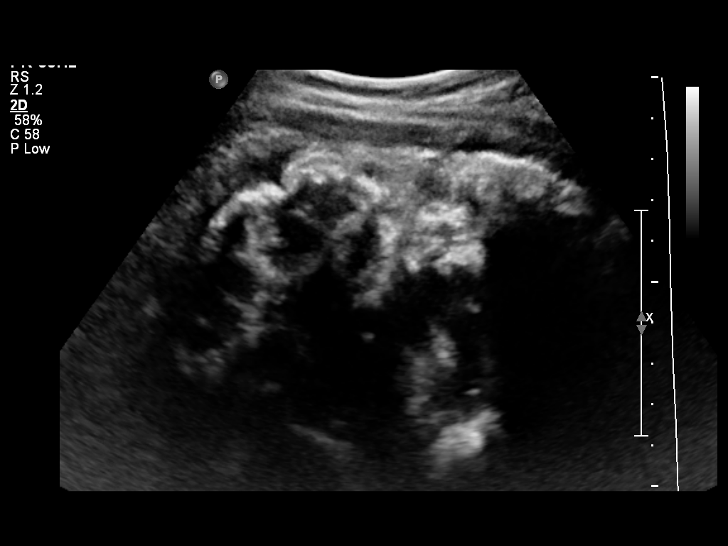
[im 18/22]
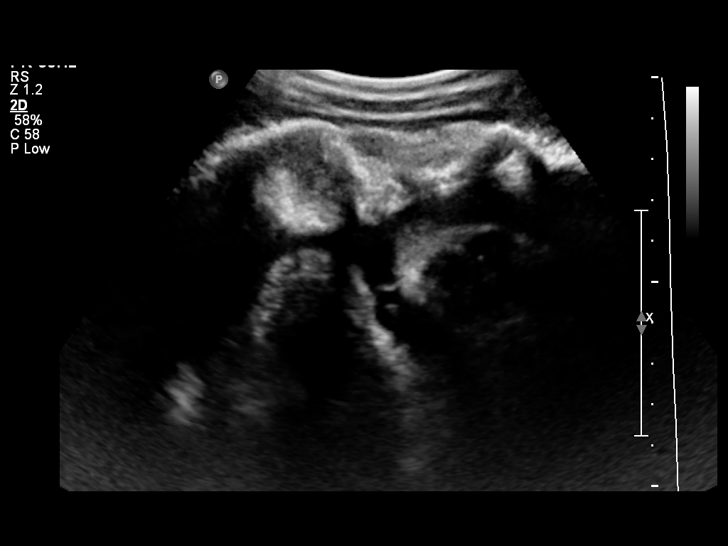
[im 20/22]
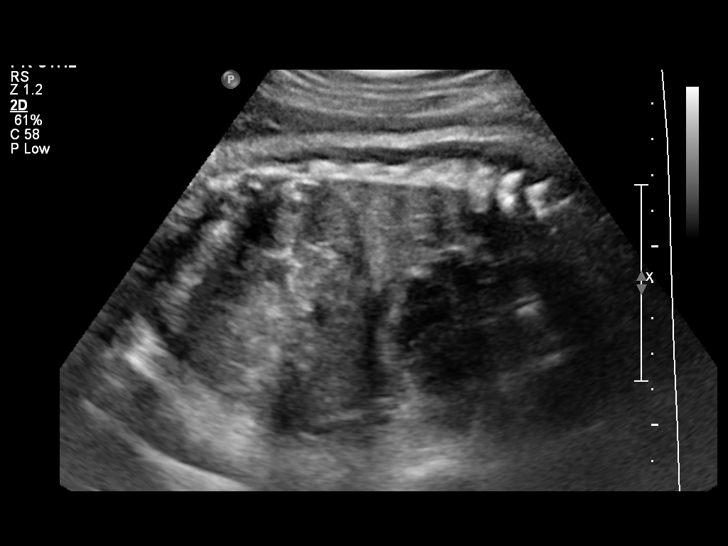
[im 22/22]
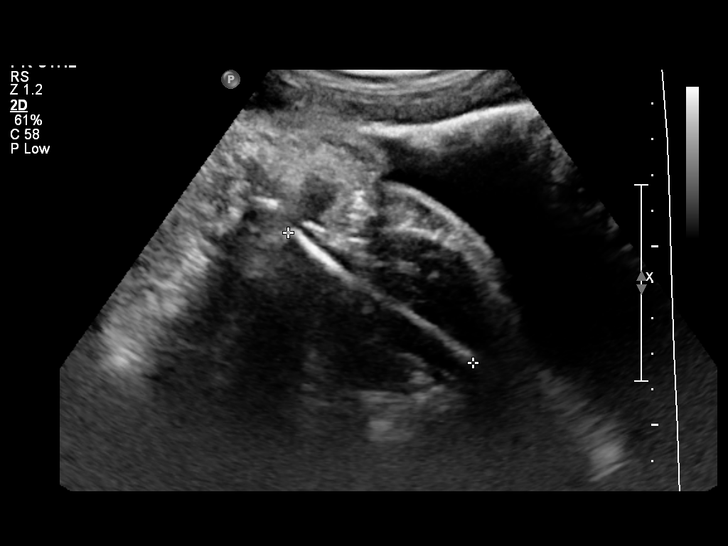

[12 of 22 positions shown; findings below may reference images not displayed]

OBSTETRICS REPORT
                      (Signed Final 10/14/2013 [DATE])

Service(s) Provided

 US OB COMP + 14 WK                                    76805.1
Indications

 Unsure of LMP;  Establish Gestational [AGE]
 No or Little Prenatal Care
 Active preterm labor, fetus 1
Fetal Evaluation

 Num Of Fetuses:    1
 Fetal Heart Rate:  146                          bpm
 Cardiac Activity:  Observed
 Presentation:      Cephalic
 Placenta:          Posterior Fundal, above
                    cervical os

 Amniotic Fluid
 AFI FV:      Subjectively within normal limits
 AFI Sum:     9.1     cm       13  %Tile     Larg Pckt:    3.04  cm
 RUQ:   3.04    cm   RLQ:    1.72   cm    LUQ:   2.11    cm   LLQ:    2.23   cm
Biometry

 BPD:     86.7  mm     G. Age:  35w 0d                CI:        86.22   70 - 86
                                                      FL/HC:      21.5   19.4 -

 HC:       294  mm     G. Age:  32w 4d      < 3  %    HC/AC:      0.89   0.96 -

 AC:     330.1  mm     G. Age:  36w 6d     > 97  %    FL/BPD:     72.8   71 - 87
 FL:      63.1  mm     G. Age:  32w 5d        9  %    FL/AC:      19.1   20 - 24

 Est. FW:    7020  gm    5 lb 12 oz      76  %
Gestational Age

 U/S Today:     34w 2d                                        EDD:   11/23/13
 Best:          34w 2d     Det. By:  U/S (10/14/13)           EDD:   11/23/13
Anatomy

 Cranium:          Appears normal         Aortic Arch:      Not well visualized
 Fetal Cavum:      Not well visualized    Ductal Arch:      Not well visualized
 Ventricles:       Not well visualized    Diaphragm:        Appears normal
 Choroid Plexus:   Not well visualized    Stomach:          Not well visualized
 Cerebellum:       Not well visualized    Abdomen:          Not well visualized
 Posterior Fossa:  Not well visualized    Abdominal Wall:   Not well visualized
 Nuchal Fold:      Not well visualized    Cord Vessels:     Not well visualized
 Face:             Profile appears        Kidneys:          Not well visualized
                   normal
 Lips:             Not well visualized    Bladder:          Appears normal
 Heart:            Not well visualized    Spine:            Not well visualized
 RVOT:             Not well visualized    Lower             Not well visualized
                                          Extremities:
 LVOT:             Not well visualized    Upper             Not well visualized
                                          Extremities:

 Other:  Technically difficult due to advanced GA and fetal position. Patient in
         active labor and contracting q 2 mins. Patient asked me to stop the
         exam early, she was too uncomfortable to continue.
Cervix Uterus Adnexa

 Cervix:       Not visualized (advanced GA >93wks)
Impression

 Single IUP at 34 [DATE] weeks
 Late onset of care
 Limited views of the fetal anatomy obtained due to late
 gestational age
 No gross anomalies noted
 Estimated fetal weight at the 76th %tile.  The AC measures >
 97th %tile
 Normal amniotic fluid volume
Recommendations

 Recommend follow-up ultrasound examination in 3-4 weeks
 for interval growth given large AC.

 questions or concerns.

## 2016-03-19 ENCOUNTER — Ambulatory Visit: Payer: Medicaid Other | Admitting: Obstetrics

## 2016-04-16 ENCOUNTER — Ambulatory Visit: Payer: Medicaid Other | Admitting: Obstetrics

## 2016-10-20 ENCOUNTER — Encounter (HOSPITAL_COMMUNITY): Payer: Self-pay

## 2016-10-20 ENCOUNTER — Emergency Department (HOSPITAL_COMMUNITY): Payer: Medicaid Other

## 2016-10-20 ENCOUNTER — Emergency Department (HOSPITAL_COMMUNITY)
Admission: EM | Admit: 2016-10-20 | Discharge: 2016-10-20 | Disposition: A | Discharge status: Home / Self Care | Payer: Medicaid Other | Attending: Emergency Medicine | Admitting: Emergency Medicine

## 2016-10-20 DIAGNOSIS — K8050 Calculus of bile duct without cholangitis or cholecystitis without obstruction: Secondary | ICD-10-CM

## 2016-10-20 DIAGNOSIS — R109 Unspecified abdominal pain: Secondary | ICD-10-CM | POA: Diagnosis present

## 2016-10-20 DIAGNOSIS — K8020 Calculus of gallbladder without cholecystitis without obstruction: Secondary | ICD-10-CM | POA: Insufficient documentation

## 2016-10-20 LAB — COMPREHENSIVE METABOLIC PANEL
ALT: 72 U/L — ABNORMAL HIGH (ref 14–54)
AST: 186 U/L — AB (ref 15–41)
Albumin: 4 g/dL (ref 3.5–5.0)
Alkaline Phosphatase: 62 U/L (ref 38–126)
Anion gap: 9 (ref 5–15)
BUN: 10 mg/dL (ref 6–20)
CO2: 24 mmol/L (ref 22–32)
Calcium: 9.2 mg/dL (ref 8.9–10.3)
Chloride: 106 mmol/L (ref 101–111)
Creatinine, Ser: 0.81 mg/dL (ref 0.44–1.00)
GFR calc Af Amer: >60 mL/min (ref >60)
GFR calc non Af Amer: >60 mL/min (ref >60)
GLUCOSE: 133 mg/dL — AB (ref 65–99)
Potassium: 3.5 mmol/L (ref 3.5–5.1)
SODIUM: 139 mmol/L (ref 135–145)
TOTAL PROTEIN: 7.1 g/dL (ref 6.5–8.1)
Total Bilirubin: 1 mg/dL (ref 0.3–1.2)

## 2016-10-20 LAB — CBC
HEMATOCRIT: 41.3 % (ref 36.0–46.0)
Hemoglobin: 13.4 g/dL (ref 12.0–15.0)
MCH: 27.4 pg (ref 26.0–34.0)
MCHC: 32.4 g/dL (ref 30.0–36.0)
MCV: 84.5 fL (ref 78.0–100.0)
Platelets: 175 10*3/uL (ref 150–400)
RBC: 4.89 MIL/uL (ref 3.87–5.11)
RDW: 12.3 % (ref 11.5–15.5)
WBC: 8.8 10*3/uL (ref 4.0–10.5)

## 2016-10-20 LAB — URINALYSIS, ROUTINE W REFLEX MICROSCOPIC
BILIRUBIN URINE: NEGATIVE
GLUCOSE, UA: NEGATIVE mg/dL
Hgb urine dipstick: NEGATIVE
Ketones, ur: NEGATIVE mg/dL
Nitrite: NEGATIVE
PH: 8 (ref 5.0–8.0)
Protein, ur: 100 mg/dL — AB
Specific Gravity, Urine: 1.027 (ref 1.005–1.030)

## 2016-10-20 LAB — LIPASE, BLOOD: LIPASE: 12 U/L (ref 11–51)

## 2016-10-20 LAB — POC URINE PREG, ED: Preg Test, Ur: NEGATIVE

## 2016-10-20 MED ORDER — SODIUM CHLORIDE 0.9 % IV BOLUS (SEPSIS)
1000.0000 mL | Freq: Once | INTRAVENOUS | Status: AC
Start: 2016-10-20 — End: 2016-10-20
  Administered 2016-10-20: 1000 mL via INTRAVENOUS

## 2016-10-20 MED ORDER — ONDANSETRON HCL 4 MG/2ML IJ SOLN
4.0000 mg | Freq: Once | INTRAMUSCULAR | Status: AC
  Administered 2016-10-20: 4 mg via INTRAVENOUS
  Filled 2016-10-20: qty 2

## 2016-10-20 MED ORDER — OXYCODONE-ACETAMINOPHEN 5-325 MG PO TABS: 1.0000 | ORAL_TABLET | Freq: Three times a day (TID) | ORAL | 0 refills | Status: DC | PRN

## 2016-10-20 MED ORDER — PROMETHAZINE HCL 25 MG PO TABS: 25.0000 mg | ORAL_TABLET | Freq: Four times a day (QID) | ORAL | 0 refills | Status: DC | PRN

## 2016-10-20 NOTE — ED Notes (Signed)
Patient transported to Ultrasound 

## 2016-10-20 NOTE — ED Triage Notes (Signed)
Pt complaining of right lower abdominal pain. Pt complaining of N/V. States 4 episodes of emesis today. Pt also complaining of dysuria.

## 2016-10-20 NOTE — ED Provider Notes (Signed)
[facility]-EMERGENCY DEPT Provider Note   CSN: [id number] Arrival date & time: 10/20/16  0051    History   Chief Complaint Chief Complaint  Patient presents with  . Abdominal Pain    HPI Karen [name] is a 26 y.o. female.  26 year old female since the emergency department for evaluation of abdominal pain. She reports right-sided abdominal pain which began after eating [place] food earlier yesterday afternoon. She had persistent pain despite taking a laxative. She actually reports that this made her symptoms worse. She has had nausea as well as 4 episodes of vomiting prior to arrival. She has been passing flatus and had a more constipated bowel movement yesterday. No associated fevers, dysuria, hematuria, vaginal bleeding, or vaginal discharge. Patient denies a history of abdominal surgeries. She reports that she has had similar pain in the past after eating fatty foods.   The history is provided by the patient. No language interpreter was used.  Abdominal Pain      Past Medical History:  Diagnosis Date  . Medical history non-contributory     Patient Active Problem List   Diagnosis Date Noted  . Pregnancy 10/14/2013    Past Surgical History:  Procedure Laterality Date  . WISDOM TOOTH EXTRACTION      OB History    Gravida Para Term Preterm AB Living   2 1 0 1 1 1    SAB TAB Ectopic Multiple Live Births   0 1 0 0 1       Home Medications    Prior to Admission medications   Medication Sig Start Date End Date Taking? Authorizing Provider  medroxyPROGESTERone (DEPO-PROVERA) 150 MG/ML injection Inject 1 mL (150 mg total) into the muscle every 3 (three) months. 12/30/13  Yes Brock Bad, MD  oxyCODONE-acetaminophen (PERCOCET/ROXICET) 5-325 MG tablet Take 1-2 tablets by mouth every 8 (eight) hours as needed for severe pain. 10/20/16   Antony Madura, PA-C  promethazine (PHENERGAN) 25 MG tablet Take 1 tablet (25 mg total) by mouth every 6 (six) hours as needed for nausea or  vomiting. 10/20/16   Antony Madura, PA-C    Family History History reviewed. No pertinent family history.  Social History Social History  Substance Use Topics  . Smoking status: Never Smoker  . Smokeless tobacco: Never Used  . Alcohol use No     Allergies   Patient has no known allergies.   Review of Systems Review of Systems  Gastrointestinal: Positive for abdominal pain.  Ten systems reviewed and are negative for acute change, except as noted in the HPI.    Physical Exam Updated Vital Signs BP 128/83   Pulse (!) 101   Temp 98.1 F (36.7 C) (Oral)   Resp 16   Ht 5\' 4"  (1.626 m)   Wt 62.3 kg   SpO2 100%   BMI 23.57 kg/m   Physical Exam  Constitutional: She is oriented to person, place, and time. She appears well-developed and well-nourished. No distress.  Nontoxic and in NAD  HENT:  Head: Normocephalic and atraumatic.  Eyes: Conjunctivae and EOM are normal. No scleral icterus.  Neck: Normal range of motion.  Cardiovascular: Normal rate, regular rhythm and intact distal pulses.   Pulmonary/Chest: Effort normal. No respiratory distress. She has no wheezes.  Respirations even and unlabored  Abdominal: Soft. She exhibits no distension. There is no tenderness. There is no guarding.  Soft abdomen. No focal TTP. No peritoneal signs or masses.  Musculoskeletal: Normal range of motion.  Neurological: She is alert  and oriented to person, place, and time. She exhibits normal muscle tone. Coordination normal.  GCS 15. Patient moving all extremities. She ambulates with steady gait.  Skin: Skin is warm and dry. No rash noted. She is not diaphoretic. No erythema. No pallor.  Psychiatric: She has a normal mood and affect. Her behavior is normal.  Nursing note and vitals reviewed.    ED Treatments / Results  Labs (all labs ordered are listed, but only abnormal results are displayed) Labs Reviewed  COMPREHENSIVE METABOLIC PANEL - Abnormal; Notable for the following:        Result Value   Glucose, Bld 133 (*)    AST 186 (*)    ALT 72 (*)    All other components within normal limits  URINALYSIS, ROUTINE W REFLEX MICROSCOPIC - Abnormal; Notable for the following:    Color, Urine AMBER (*)    APPearance CLOUDY (*)    Protein, ur 100 (*)    Leukocytes, UA TRACE (*)    Bacteria, UA RARE (*)    Squamous Epithelial / LPF TOO NUMEROUS TO COUNT (*)    All other components within normal limits  LIPASE, BLOOD  CBC  POC URINE PREG, ED    EKG  EKG Interpretation None       Radiology [place] Abdomen Limited  Result Date: 10/20/2016 CLINICAL DATA:  Initial evaluation for acute right-sided abdominal pain, nausea, vomiting. EXAM: US ABDOMEN LIMITED - RIGHT UPPER QUADRANT COMPARISON:  None. FINDINGS: Gallbladder: Stones and sludge present within the gallbladder lumen. Largest stone measured approximately 5-6 mm. Gallbladder wall measure within normal limits at 2.4 mm. No free pericholecystic fluid. No sonographic Murphy sign elicited on exam. Common bile duct: Diameter: 2.6 mm Liver: No focal lesion identified. Within normal limits in parenchymal echogenicity. IMPRESSION: 1. Stones and sludge within the gallbladder lumen. No other sonographic features to suggest acute cholecystitis. 2. No biliary dilatation. Electronically Signed   By: Rise MuBenjamin  McClintock M.D.   On: 10/20/2016 03:10    Procedures Procedures (including critical care time)  Medications Ordered in ED Medications  ondansetron (ZOFRAN) injection 4 mg (4 mg Intravenous Given 10/20/16 0154)  sodium chloride 0.9 % bolus 1,000 mL (1,000 mLs Intravenous New Bag/Given 10/20/16 0154)     Initial Impression / Assessment and Plan / ED Course  I have reviewed the triage vital signs and the nursing notes.  Pertinent labs & imaging results that were available during my care of the patient were reviewed by me and considered in my medical decision making (see chart for details).     [age] year old female presents to  the emergency department for onset of right-sided abdominal pain which began after eating [place] food. She had nausea and 4 episodes of vomiting. Patient with no complaints of pain on arrival. She had a soft and nontender abdomen. This remains stable on repeat exam. Workup today reveals mild transaminitis. Normal alkaline phosphatase and total bilirubin. No leukocytosis or fever.  Ultrasound reveals gallstones and sludge without. Cholecystic fluid or gallbladder wall thickening. The patient has been monitored for several hours without repeat symptoms. Pain suspected secondary to biliary colic. Plan to refer to general surgery for outpatient follow-up. Have discussed the need for elective cholecystectomy. Patient advised to refrain from fatty or fried foods. Return precautions discussed and provided. Patient discharged in stable condition with no unaddressed concerns.   Final Clinical Impressions(s) / ED Diagnoses   Final diagnoses:  Calculus of gallbladder without cholecystitis without obstruction  Biliary colic  New Prescriptions New Prescriptions   OXYCODONE-ACETAMINOPHEN (PERCOCET/ROXICET) 5-325 MG TABLET    Take 1-2 tablets by mouth every 8 (eight) hours as needed for severe pain.   PROMETHAZINE (PHENERGAN) 25 MG TABLET    Take 1 tablet (25 mg total) by mouth every 6 (six) hours as needed for nausea or vomiting.     Antony Madura, PA-C 10/20/16 0415    Charlynne Pander, MD 10/20/16 [phone]

## 2017-02-09 ENCOUNTER — Encounter (HOSPITAL_COMMUNITY): Payer: Self-pay | Admitting: *Deleted

## 2017-02-09 ENCOUNTER — Emergency Department (HOSPITAL_COMMUNITY)
Admission: EM | Admit: 2017-02-09 | Discharge: 2017-02-09 | Disposition: A | Discharge status: Home / Self Care | Payer: Managed Care, Other (non HMO) | Attending: Emergency Medicine | Admitting: Emergency Medicine

## 2017-02-09 ENCOUNTER — Emergency Department (HOSPITAL_COMMUNITY): Payer: Managed Care, Other (non HMO)

## 2017-02-09 DIAGNOSIS — R112 Nausea with vomiting, unspecified: Secondary | ICD-10-CM | POA: Diagnosis not present

## 2017-02-09 DIAGNOSIS — R740 Nonspecific elevation of levels of transaminase and lactic acid dehydrogenase [LDH]: Secondary | ICD-10-CM | POA: Diagnosis not present

## 2017-02-09 DIAGNOSIS — K8050 Calculus of bile duct without cholangitis or cholecystitis without obstruction: Secondary | ICD-10-CM | POA: Diagnosis not present

## 2017-02-09 DIAGNOSIS — R945 Abnormal results of liver function studies: Secondary | ICD-10-CM | POA: Diagnosis not present

## 2017-02-09 DIAGNOSIS — R17 Unspecified jaundice: Secondary | ICD-10-CM

## 2017-02-09 DIAGNOSIS — R7401 Elevation of levels of liver transaminase levels: Secondary | ICD-10-CM

## 2017-02-09 DIAGNOSIS — R1011 Right upper quadrant pain: Secondary | ICD-10-CM | POA: Diagnosis present

## 2017-02-09 DIAGNOSIS — Z79899 Other long term (current) drug therapy: Secondary | ICD-10-CM | POA: Insufficient documentation

## 2017-02-09 DIAGNOSIS — K8020 Calculus of gallbladder without cholecystitis without obstruction: Secondary | ICD-10-CM | POA: Insufficient documentation

## 2017-02-09 HISTORY — DX: Calculus of gallbladder without cholecystitis without obstruction: K80.20

## 2017-02-09 LAB — URINALYSIS, ROUTINE W REFLEX MICROSCOPIC
BILIRUBIN URINE: NEGATIVE
GLUCOSE, UA: NEGATIVE mg/dL
HGB URINE DIPSTICK: NEGATIVE
KETONES UR: NEGATIVE mg/dL
NITRITE: NEGATIVE
PH: 7 (ref 5.0–8.0)
PROTEIN: NEGATIVE mg/dL
Specific Gravity, Urine: 1.023 (ref 1.005–1.030)

## 2017-02-09 LAB — CBC
HCT: 43.5 % (ref 36.0–46.0)
HEMOGLOBIN: 14 g/dL (ref 12.0–15.0)
MCH: 27.5 pg (ref 26.0–34.0)
MCHC: 32.2 g/dL (ref 30.0–36.0)
MCV: 85.5 fL (ref 78.0–100.0)
PLATELETS: 185 10*3/uL (ref 150–400)
RBC: 5.09 MIL/uL (ref 3.87–5.11)
RDW: 12.2 % (ref 11.5–15.5)
WBC: 5.2 10*3/uL (ref 4.0–10.5)

## 2017-02-09 LAB — COMPREHENSIVE METABOLIC PANEL
ALK PHOS: 68 U/L (ref 38–126)
ALT: 53 U/L (ref 14–54)
ANION GAP: 9 (ref 5–15)
AST: 98 U/L — ABNORMAL HIGH (ref 15–41)
Albumin: 4.2 g/dL (ref 3.5–5.0)
BUN: 9 mg/dL (ref 6–20)
CALCIUM: 9.7 mg/dL (ref 8.9–10.3)
CO2: 26 mmol/L (ref 22–32)
CREATININE: 0.85 mg/dL (ref 0.44–1.00)
Chloride: 102 mmol/L (ref 101–111)
Glucose, Bld: 97 mg/dL (ref 65–99)
Potassium: 3.9 mmol/L (ref 3.5–5.1)
SODIUM: 137 mmol/L (ref 135–145)
Total Bilirubin: 1.3 mg/dL — ABNORMAL HIGH (ref 0.3–1.2)
Total Protein: 7.5 g/dL (ref 6.5–8.1)

## 2017-02-09 LAB — I-STAT BETA HCG BLOOD, ED (MC, WL, AP ONLY): I-stat hCG, quantitative: <5 m[IU]/mL (ref <5)

## 2017-02-09 LAB — LIPASE, BLOOD: LIPASE: 21 U/L (ref 11–51)

## 2017-02-09 MED ORDER — ONDANSETRON HCL 4 MG/2ML IJ SOLN
4.0000 mg | Freq: Once | INTRAMUSCULAR | Status: AC
  Administered 2017-02-09: 4 mg via INTRAVENOUS
  Filled 2017-02-09: qty 2

## 2017-02-09 MED ORDER — ONDANSETRON 4 MG PO TBDP: 4.0000 mg | ORAL_TABLET | Freq: Three times a day (TID) | ORAL | 0 refills | Status: DC | PRN

## 2017-02-09 MED ORDER — FAMOTIDINE IN NACL 20-0.9 MG/50ML-% IV SOLN
20.0000 mg | Freq: Once | INTRAVENOUS | Status: AC
  Administered 2017-02-09: 20 mg via INTRAVENOUS
  Filled 2017-02-09: qty 50

## 2017-02-09 MED ORDER — NAPROXEN 500 MG PO TABS: 500.0000 mg | ORAL_TABLET | Freq: Two times a day (BID) | ORAL | 0 refills | Status: DC | PRN

## 2017-02-09 MED ORDER — HYDROCODONE-ACETAMINOPHEN 5-325 MG PO TABS: 1.0000 | ORAL_TABLET | Freq: Four times a day (QID) | ORAL | 0 refills | Status: DC | PRN

## 2017-02-09 MED ORDER — SODIUM CHLORIDE 0.9 % IV BOLUS (SEPSIS)
1000.0000 mL | Freq: Once | INTRAVENOUS | Status: AC
  Administered 2017-02-09: 1000 mL via INTRAVENOUS

## 2017-02-09 MED ORDER — MORPHINE SULFATE (PF) 4 MG/ML IV SOLN
4.0000 mg | Freq: Once | INTRAVENOUS | Status: AC
  Administered 2017-02-09: 4 mg via INTRAVENOUS
  Filled 2017-02-09: qty 1

## 2017-02-09 NOTE — ED Triage Notes (Signed)
PT states abdominal pain that resembles pain r/t gallstones.  Due for surgery on 7/28, but pain is unbearable.

## 2017-02-09 NOTE — Discharge Instructions (Signed)
Your abdominal pain and symptoms are due to your gallstones. It's very important that you follow up with the surgeon in order to discuss having your gallbladder removed. Avoid spicy/fatty/acidic/fried foods, avoid soda/coffee/tea/alcohol. Avoid laying down flat within 30 minutes of eating. Avoid NSAIDs like ibuprofen/aleve/motrin/naprosyn/etc on an empty stomach. May consider using over the counter tums/maalox/zantac as needed for additional relief. Use zofran as directed as needed for nausea. Use norco as directed as needed for pain but don't drive or operate machinery while taking this medication. Follow up with the surgeon in 5-7 days for recheck of symptoms and ongoing management of your gallbladder issues. Return to the ER for changes or worsening symptoms.  Abdominal (belly) pain can be caused by many things. Your caregiver performed an examination and possibly ordered blood/urine tests and imaging (CT scan, x-rays, ultrasound). Many cases can be observed and treated at home after initial evaluation in the emergency department. Even though you are being discharged home, abdominal pain can be unpredictable. Therefore, you need a repeated exam if your pain does not resolve, returns, or worsens. Most patients with abdominal pain don't have to be admitted to the hospital or have surgery, but serious problems like appendicitis and gallbladder attacks can start out as nonspecific pain. Many abdominal conditions cannot be diagnosed in one visit, so follow-up evaluations are very important. SEEK IMMEDIATE MEDICAL ATTENTION IF YOU DEVELOP ANY OF THE FOLLOWING SYMPTOMS: The pain does not go away or becomes severe.  A temperature above 101 develops.  Repeated vomiting occurs (multiple episodes).  The pain becomes localized to portions of the abdomen. The right side could possibly be appendicitis. In an adult, the left lower portion of the abdomen could be colitis or diverticulitis.  Blood is being passed in  stools or vomit (bright red or black tarry stools).  Return also if you develop chest pain, difficulty breathing, dizziness or fainting, or become confused, poorly responsive, or inconsolable (young children). The constipation stays for more than 4 days.  There is belly (abdominal) or rectal pain.  You do not seem to be getting better.

## 2017-02-09 NOTE — ED Notes (Signed)
Family at bedside. Pt sister.

## 2017-02-09 NOTE — ED Notes (Signed)
Patient transported to Ultrasound 

## 2017-02-09 NOTE — ED Provider Notes (Signed)
[facility] DEPT Provider Note   CSN: [id number] Arrival date & time: 02/09/17  1051     History   Chief Complaint Chief Complaint  Patient presents with  . Abdominal Pain    HPI Karen [name] is a 26 y.o. female with a PMHx of cholelithiasis and gallbladder sludge diagnosed on U/S 09/2016, who presents to the ED with complaints of sudden onset right lateral abdomen pain that she states feels very similar to her prior gallbladder attack. She describes the pain is 9/10 constant crampy nonradiating right lateral/upper abdominal pain, worse with eating, and with no treatments tried prior to arrival. She had one episode of nonbloody nonbilious emesis with associated nausea this morning. She has not yet scheduled follow-up with a surgeon and contrary to what the triage note states, she is not scheduled for surgery to have her gallbladder removed. Her PCP is Dr. Jodi Mourning. LMP 01/26/17.  She denies fevers, chills, CP, SOB, diarrhea/constipation, obstipation, melena, hematochezia, hematemesis, hematuria, dysuria, vaginal bleeding/discharge, myalgias, arthralgias, numbness, tingling, focal weakness, or any other complaints at this time. Denies recent travel, sick contacts, suspicious food intake, EtOH use, NSAID use, or prior abd surgeries.    The history is provided by the patient and medical records. No language interpreter was used.  Abdominal Pain   This is a recurrent problem. The current episode started 6 to 12 hours ago. The problem occurs constantly. The problem has not changed since onset.The pain is associated with an unknown factor. The pain is located in the RUQ. The quality of the pain is cramping. The pain is at a severity of 9/10. The pain is moderate. Associated symptoms include nausea and vomiting. Pertinent negatives include fever, diarrhea, flatus, hematochezia, melena, constipation, dysuria, hematuria, arthralgias and myalgias. The symptoms are aggravated by eating. Nothing relieves  the symptoms. Past workup includes ultrasound. Her past medical history is significant for gallstones.    Past Medical History:  Diagnosis Date  . Gallstones   . Medical history non-contributory     Patient Active Problem List   Diagnosis Date Noted  . Pregnancy 10/14/2013    Past Surgical History:  Procedure Laterality Date  . WISDOM TOOTH EXTRACTION      OB History    Gravida Para Term Preterm AB Living   2 1 0 '1 1 1   ' SAB TAB Ectopic Multiple Live Births   0 1 0 0 1       Home Medications    Prior to Admission medications   Medication Sig Start Date End Date Taking? Authorizing Provider  medroxyPROGESTERone (DEPO-PROVERA) 150 MG/ML injection Inject 1 mL (150 mg total) into the muscle every 3 (three) months. 12/30/13   Shelly Bombard, MD  oxyCODONE-acetaminophen (PERCOCET/ROXICET) 5-325 MG tablet Take 1-2 tablets by mouth every 8 (eight) hours as needed for severe pain. 10/20/16   Antonietta Breach, PA-C  promethazine (PHENERGAN) 25 MG tablet Take 1 tablet (25 mg total) by mouth every 6 (six) hours as needed for nausea or vomiting. 10/20/16   Antonietta Breach, PA-C    Family History No family history on file.  Social History Social History  Substance Use Topics  . Smoking status: Never Smoker  . Smokeless tobacco: Never Used  . Alcohol use No     Allergies   Patient has no known allergies.   Review of Systems Review of Systems  Constitutional: Negative for chills and fever.  Respiratory: Negative for shortness of breath.   Cardiovascular: Negative for chest pain.  Gastrointestinal:  Positive for abdominal pain, nausea and vomiting. Negative for blood in stool, constipation, diarrhea, flatus, hematochezia and melena.  Genitourinary: Negative for dysuria, hematuria, vaginal bleeding and vaginal discharge.  Musculoskeletal: Negative for arthralgias and myalgias.  Skin: Negative for color change.  Allergic/Immunologic: Negative for immunocompromised state.    Neurological: Negative for weakness and numbness.  Psychiatric/Behavioral: Negative for confusion.   All other systems reviewed and are negative for acute change except as noted in the HPI.    Physical Exam Updated Vital Signs BP 128/82 (BP Location: Right Arm)   Pulse 99   Temp 98.2 F (36.8 C) (Oral)   Resp 18   Ht '5\' 4"'  (1.626 m)   Wt 63.5 kg (140 lb)   LMP 01/26/2017   SpO2 100%   BMI 24.03 kg/m   Physical Exam  Constitutional: She is oriented to person, place, and time. Vital signs are normal. She appears well-developed and well-nourished.  Non-toxic appearance. No distress.  Afebrile, nontoxic, NAD  HENT:  Head: Normocephalic and atraumatic.  Mouth/Throat: Oropharynx is clear and moist and mucous membranes are normal.  Eyes: Conjunctivae and EOM are normal. Right eye exhibits no discharge. Left eye exhibits no discharge.  Neck: Normal range of motion. Neck supple.  Cardiovascular: Normal rate, regular rhythm, normal heart sounds and intact distal pulses.  Exam reveals no gallop and no friction rub.   No murmur heard. Pulmonary/Chest: Effort normal and breath sounds normal. No respiratory distress. She has no decreased breath sounds. She has no wheezes. She has no rhonchi. She has no rales.  Abdominal: Soft. Normal appearance and bowel sounds are normal. She exhibits no distension. There is tenderness in the right upper quadrant. There is positive Murphy's sign. There is no rigidity, no rebound, no guarding, no CVA tenderness and no tenderness at McBurney's point.    Soft, nondistended, +BS throughout, with mild RUQ TTP, no r/g/r, +murphy's, neg mcburney's, no CVA TTP   Musculoskeletal: Normal range of motion.  Neurological: She is alert and oriented to person, place, and time. She has normal strength. No sensory deficit.  Skin: Skin is warm, dry and intact. No rash noted.  Psychiatric: She has a normal mood and affect.  Nursing note and vitals reviewed.    ED  Treatments / Results  Labs (all labs ordered are listed, but only abnormal results are displayed) Labs Reviewed  COMPREHENSIVE METABOLIC PANEL - Abnormal; Notable for the following:       Result Value   AST 98 (*)    Total Bilirubin 1.3 (*)    All other components within normal limits  URINALYSIS, ROUTINE W REFLEX MICROSCOPIC - Abnormal; Notable for the following:    Color, Urine AMBER (*)    APPearance HAZY (*)    Leukocytes, UA TRACE (*)    Bacteria, UA RARE (*)    Squamous Epithelial / LPF 6-30 (*)    All other components within normal limits  LIPASE, BLOOD  CBC  I-STAT BETA HCG BLOOD, ED ([facility], [facility], [facility] ONLY)    EKG  EKG Interpretation None       Radiology US Abdomen Complete  Result Date: 02/09/2017 CLINICAL DATA:  Right upper quadrant pain EXAM: ABDOMEN ULTRASOUND COMPLETE COMPARISON:  None. FINDINGS: Gallbladder: Numerous small stones and sludge within the gallbladder. The stones or scratched at the stones are 4 mm or less in size. No wall thickening. Common bile duct: Diameter: Normal caliber, 4 mm Liver: No focal lesion identified. Within normal limits in parenchymal echogenicity. IVC:  No abnormality visualized. Pancreas: Visualized portion unremarkable. Spleen: Size and appearance within normal limits. Right Kidney: Length: 10.6 cm. Echogenicity within normal limits. No mass or hydronephrosis visualized. Left Kidney: Length: 10.4 cm. Echogenicity within normal limits. No mass or hydronephrosis visualized. Abdominal aorta: No aneurysm visualized. Other findings: None. IMPRESSION: Cholelithiasis.  No sonographic evidence of acute cholecystitis. Electronically Signed   By: Rolm Baptise M.D.   On: 02/09/2017 15:02    Procedures Procedures (including critical care time)  Medications Ordered in ED Medications  sodium chloride 0.9 % bolus 1,000 mL (0 mLs Intravenous Stopped 02/09/17 1415)  morphine 4 MG/ML injection 4 mg (4 mg Intravenous Given 02/09/17 1239)  ondansetron  (ZOFRAN) injection 4 mg (4 mg Intravenous Given 02/09/17 1237)  famotidine (PEPCID) IVPB 20 mg premix (0 mg Intravenous Stopped 02/09/17 1415)     Initial Impression / Assessment and Plan / ED Course  I have reviewed the triage vital signs and the nursing notes.  Pertinent labs & imaging results that were available during my care of the patient were reviewed by me and considered in my medical decision making (see chart for details).     26 y.o. female here with sudden onset RUQ pain and n/v this morning; has been seen in 09/2016 and diagnosed with gallstones and gallbladder sludge; hasn't yet followed up with surgeon. States it feels similar to that attack. On exam, mild RUQ TTP, +murphy's sign, afebrile and nontoxic. Labs pending, will await these and get abd U/S to eval for cholecystitis. Will give pain meds, nausea meds, and fluids. Will reassess shortly  4:05 PM CBC WNL. CMP with mildly elevated AST 98 and bili 1.3, ALT and alk phos WNL, otherwise CMP WNL. Comparative to 09/2016 visit, LFTs after better (except for marginally elevated bili today, but AST/ALT much higher at last visit). Lipase WNL. U/A grossly contaminated with 6-30 squamous cells; rare bacteria, doubt UTI, and doubt Cx on this specimen would be beneficial. BetaHCG neg. Abd U/S with cholelithiasis without acute cholecystitis. Pt with full resolution of symptoms, tolerating PO well, feels better. States she didn't f/up with surgery just because she didn't do it, not because she can't or has difficulty with getting there/payment/etc. Given adequate pain/nausea control, and overall reassuring work up, doubt need for surgical consult. Advised that it's very important to f/up with surgery in next 1wk for recheck of symptoms and ongoing management, and to discuss cholecystectomy. Advised diet and lifestyle modifications. Will rx pain meds and nausea meds, advised staying hydrated. Tums/maalox/zantac as needed for additional relief. F/up  with surgeon ASAP, but strict return precautions advised. [facility] reviewed prior to dispensing controlled substance medications, and 1 year search was notable for: percocet #12 tabs on 10/20/16 but no other controlled substances found. Risks/benefits/alternatives and expectations discussed regarding controlled substances. Side effects of medications discussed. Informed consent obtained. I explained the diagnosis and have given explicit precautions to return to the ER including for any other new or worsening symptoms. The patient understands and accepts the medical plan as it's been dictated and I have answered their questions. Discharge instructions concerning home care and prescriptions have been given. The patient is STABLE and is discharged to home in good condition.    Final Clinical Impressions(s) / ED Diagnoses   Final diagnoses:  Acute abdominal pain in right upper quadrant  Nausea and vomiting in adult patient  Elevated bilirubin  Elevated AST (SGOT)  Calculus of gallbladder without cholecystitis without obstruction  Biliary colic    New  Prescriptions New Prescriptions   HYDROCODONE-ACETAMINOPHEN (NORCO) 5-325 MG TABLET    Take 1 tablet by mouth every 6 (six) hours as needed for severe pain.   NAPROXEN (NAPROSYN) 500 MG TABLET    Take 1 tablet (500 mg total) by mouth 2 (two) times daily as needed for mild pain, moderate pain or headache (TAKE WITH MEALS.).   ONDANSETRON (ZOFRAN ODT) 4 MG DISINTEGRATING TABLET    Take 1 tablet (4 mg total) by mouth every 8 (eight) hours as needed for nausea or vomiting.     [address] [place], [place] 02/09/17 1608    Quintella Reichert, MD 02/19/17 [phone]

## 2017-04-15 ENCOUNTER — Ambulatory Visit: Payer: Self-pay | Admitting: General Surgery

## 2017-04-15 NOTE — H&P (Signed)
History of Present Illness Axel Filler MD; 04/04/2017 9:50 AM) The patient is a 26 year old female who presents for evaluation of gall stones. Referred by: Dr. Tilden Fossa Chief Complaint: Abdominal pain  Patient is a 25 year old female with a right upper quadrant abdominal pain since March 2018. Patient states that the pain has subsequently come and gone on several occasions. She states it is becoming more frequent. She states it's the right upper quadrant. She states it's fairly severe when it does occur. Sharp in nature. She states that usually high fatty/acidic foods cause the pain to start. She is been staying on a for diet minimizing her fat intake. This seems to help minimally.  Patient has had a previous ultrasound. This reveals sludge and small stones. Patient's LFTs were within normal limits.    Past Surgical History [name], [place]; 04/04/2017 9:43 AM) Oral Surgery   Diagnostic Studies History [name], [place]; 04/04/2017 9:43 AM) Colonoscopy  never Mammogram  never Pap Smear  1-5 years ago  Allergies [name], RMA; 04/04/2017 9:43 AM) No Known Allergies 04/04/2017  Medication History [name], RMA; 04/04/2017 9:45 AM) Hydrocodone-Acetaminophen (5-325MG  Tablet, Oral) Active. Naproxen (  Tablet, Oral) Active. Zofran (  Tablet, Oral) Active. Oxycodone-Acetaminophen (5-325MG  Tablet, Oral) Active. Promethazine HCl (  Tablet, Oral) Active. Medications Reconciled  Pregnancy / Birth History [name], [place]; 04/04/2017 9:43 AM) Age at menarche  14 years. Contraceptive History  Depo-provera. Gravida  2 Length (months) of breastfeeding  12-24 Maternal age  48-20 Para  1 Regular periods   Other Problems [name], [place]; 04/04/2017 9:43 AM) No pertinent past medical history     Review of Systems Axel Filler MD; 04/04/2017 9:48 AM) General Present- Appetite Loss, Fatigue, Fever and Night Sweats.  Not Present- Chills, Weight Gain and Weight Loss. Skin Present- Dryness. Not Present- Change in Wart/Mole, Hives, Jaundice, New Lesions, Non-Healing Wounds, Rash and Ulcer. HEENT Present- Sinus Pain, Sore Throat and Wears glasses/contact lenses. Not Present- Earache, Hearing Loss, Hoarseness, Nose Bleed, Oral Ulcers, Ringing in the Ears, Seasonal Allergies, Visual Disturbances and Yellow Eyes. Respiratory Not Present- Cough and Difficulty Breathing. Breast Present- Breast Pain. Not Present- Breast Mass, Nipple Discharge and Skin Changes. Cardiovascular Not Present- Chest Pain, Difficulty Breathing Lying Down, Leg Cramps, Palpitations, Rapid Heart Rate, Shortness of Breath and Swelling of Extremities. Gastrointestinal Present- Abdominal Pain, Bloating, Change in Bowel Habits, Chronic diarrhea, Constipation, Excessive gas, Gets full quickly at meals, Indigestion, Nausea and Vomiting. Not Present- Bloody Stool, Difficulty Swallowing, Hemorrhoids and Rectal Pain. Female Genitourinary Present- Frequency, Nocturia, Painful Urination, Pelvic Pain and Urgency. Musculoskeletal Not Present- Back Pain, Joint Pain, Joint Stiffness, Muscle Pain, Muscle Weakness and Swelling of Extremities. Neurological Present- Headaches and Weakness. Not Present- Decreased Memory, Fainting, Numbness, Seizures, Tingling, Tremor and Trouble walking. Psychiatric Not Present- Anxiety, Bipolar, Change in Sleep Pattern, Depression, Fearful and Frequent crying. Endocrine Not Present- Cold Intolerance, Excessive Hunger, Hair Changes, Heat Intolerance, Hot flashes and New Diabetes. Hematology Not Present- Blood Thinners, Easy Bruising, Excessive bleeding, Gland problems, HIV and Persistent Infections. All other systems negative  Vitals [name] RMA; 04/04/2017 9:45 AM) 04/04/2017 9:45 AM Weight: 160.2 lb Height: 64in Body Surface Area: 1.78 m Body Mass Index: 27.5 kg/m  Temp.: 97.26F  BP: 120/90 (Sitting, Left Arm,  Standard)       Physical Exam Axel Filler MD; 04/04/2017 9:50 AM) The physical exam findings are as follows: Note:Constitutional: No acute distress, conversant, appears stated age  Eyes: Anicteric sclerae, moist conjunctiva, no lid lag  Neck: No thyromegaly, trachea midline, no cervical lymphadenopathy  Lungs: Clear to auscultation biilaterally, normal respiratory effot  Cardiovascular: regular rate & rhythm, no murmurs, no peripheal edema, pedal pulses 2+  GI: Soft, no masses or hepatosplenomegaly, non-tender to palpation  MSK: Normal gait, no clubbing cyanosis, edema  Skin: No rashes, palpation reveals normal skin turgor  Psychiatric: Appropriate judgment and insight, oriented to person, place, and time    Assessment & Plan Axel Filler MD; 04/04/2017 9:50 AM) SYMPTOMATIC CHOLELITHIASIS (K80.20) Impression: 26 year old female symptomatic cholelithiasis  1. We will proceed to the operating room for a laparoscopic cholecystectomy  2. Risks and benefits were discussed with the patient to generally include, but not limited to: infection, bleeding, possible need for post op ERCP, damage to the bile ducts, bile leak, and possible need for further surgery. Alternatives were offered and described. All questions were answered and the patient voiced understanding of the procedure and wishes to proceed at this point with a laparoscopic cholecystectomy

## 2017-05-02 NOTE — Pre-Procedure Instructions (Signed)
Karen [name]  05/02/2017      [facility] - [place], [place] - [place] [address] [place] [address] Phone: [phone] Fax: [phone]  [facility] - [place], [place] - [place] [address] [place] [address] Phone: [phone] Fax: [phone]    Your procedure is scheduled on Wednesday October 10.  Report to [facility] Admitting at 6:30 A.M.  Call this number if you have problems the morning of surgery:  [phone]   Remember:  Do not eat food or drink liquids after midnight.  Take these medicines the morning of surgery with A SIP OF WATER: oxycodone-acetaminophen (percocet), promethezine (phenergan) if needed   Do not wear jewelry, make-up or nail polish.  Do not wear lotions, powders, or perfumes, or deoderant.  Do not shave 48 hours prior to surgery.  Men may shave face and neck.  Do not bring valuables to the hospital.  [facility] is not responsible for any belongings or valuables.  Contacts, dentures or bridgework may not be worn into surgery.  Leave your suitcase in the car.  After surgery it may be brought to your room.  For patients admitted to the hospital, discharge time will be determined by your treatment team.  Patients discharged the day of surgery will not be allowed to drive home.    Special instructions:    Experiment- Preparing For Surgery  Before surgery, you can play an important role. Because skin is not sterile, your skin needs to be as free of germs as possible. You can reduce the number of germs on your skin by washing with CHG (chlorahexidine gluconate) Soap before surgery.  CHG is an antiseptic cleaner which kills germs and bonds with the skin to continue killing germs even after washing.  Please do not use if you have an allergy to CHG or antibacterial soaps. If your skin becomes reddened/irritated stop using the CHG.  Do not shave  (including legs and underarms) for at least 48 hours prior to first CHG shower. It is OK to shave your face.  Please follow these instructions carefully.   1. Shower the NIGHT BEFORE SURGERY and the MORNING OF SURGERY with CHG.   2. If you chose to wash your hair, wash your hair first as usual with your normal shampoo.  3. After you shampoo, rinse your hair and body thoroughly to remove the shampoo.  4. Use CHG as you would any other liquid soap. You can apply CHG directly to the skin and wash gently with a scrungie or a clean washcloth.   5. Apply the CHG Soap to your body ONLY FROM THE NECK DOWN.  Do not use on open wounds or open sores. Avoid contact with your eyes, ears, mouth and genitals (private parts). Wash genitals (private parts) with your normal soap.  USE REGULAR SHAMPOO AND CONDITIONER FOR HAIR USE REGULAR SOAP FOR FACE AND PRIVATE AREA  6. Wash thoroughly, paying special attention to the area where your surgery will be performed.  7. Thoroughly rinse your body with warm water from the neck down.  8. DO NOT shower/wash with your normal soap after using and rinsing off the CHG Soap.  9. Pat yourself dry with a CLEAN TOWEL and WASH CLOTH  10. Wear CLEAN PAJAMAS to bed the night before surgery, wear comfortable clothes the morning of surgery  11. Place CLEAN SHEETS on your bed the night of  your first shower and DO NOT SLEEP WITH PETS.    Day of Surgery: Do not apply any deodorants/lotions. Please wear clean clothes to the hospital/surgery center.      Please read over the following fact sheets that you were given. Coughing and Deep Breathing

## 2017-05-05 ENCOUNTER — Encounter (HOSPITAL_COMMUNITY): Payer: Self-pay

## 2017-05-05 ENCOUNTER — Encounter (HOSPITAL_COMMUNITY)
Admission: RE | Admit: 2017-05-05 | Discharge: 2017-05-05 | Disposition: A | Discharge status: Home / Self Care | Payer: Managed Care, Other (non HMO) | Source: Ambulatory Visit | Attending: General Surgery | Admitting: General Surgery

## 2017-05-05 DIAGNOSIS — K8080 Other cholelithiasis without obstruction: Secondary | ICD-10-CM | POA: Diagnosis present

## 2017-05-05 DIAGNOSIS — K8010 Calculus of gallbladder with chronic cholecystitis without obstruction: Secondary | ICD-10-CM | POA: Diagnosis not present

## 2017-05-05 LAB — BASIC METABOLIC PANEL
ANION GAP: 7 (ref 5–15)
BUN: 14 mg/dL (ref 6–20)
CALCIUM: 9.1 mg/dL (ref 8.9–10.3)
CO2: 26 mmol/L (ref 22–32)
CREATININE: 0.82 mg/dL (ref 0.44–1.00)
Chloride: 103 mmol/L (ref 101–111)
GLUCOSE: 106 mg/dL — AB (ref 65–99)
Potassium: 3.6 mmol/L (ref 3.5–5.1)
Sodium: 136 mmol/L (ref 135–145)

## 2017-05-05 LAB — CBC
HCT: 40.4 % (ref 36.0–46.0)
HEMOGLOBIN: 13 g/dL (ref 12.0–15.0)
MCH: 27.3 pg (ref 26.0–34.0)
MCHC: 32.2 g/dL (ref 30.0–36.0)
MCV: 84.9 fL (ref 78.0–100.0)
PLATELETS: 145 10*3/uL — AB (ref 150–400)
RBC: 4.76 MIL/uL (ref 3.87–5.11)
RDW: 12.2 % (ref 11.5–15.5)
WBC: 4.2 10*3/uL (ref 4.0–10.5)

## 2017-05-05 LAB — HCG, SERUM, QUALITATIVE: PREG SERUM: NEGATIVE

## 2017-05-05 NOTE — Progress Notes (Signed)
PCP - Dr. Margy Clarks [facility]  Cardiologist - Denies  Chest x-ray - Denies  EKG - Denies  Stress Test - Denies  ECHO - Denies  Cardiac Cath - Denies  Sleep Study -No CPAP - None   Pt denies having chest pain, sob, or fever at this time. All instructions explained to the pt, with a verbal understanding of the material. Pt agrees to go over the instructions while at home for a better understanding. The opportunity to ask questions was provided.

## 2017-05-06 MED ORDER — CEFAZOLIN SODIUM-DEXTROSE 2-4 GM/100ML-% IV SOLN
2.0000 g | INTRAVENOUS | Status: AC
  Administered 2017-05-07: 2 g via INTRAVENOUS
  Filled 2017-05-06: qty 100

## 2017-05-06 NOTE — Anesthesia Preprocedure Evaluation (Addendum)
Anesthesia Evaluation  Patient identified by MRN, date of birth, ID band Patient awake    Reviewed: Allergy & Precautions, NPO status , Patient's Chart, lab work & pertinent test results  Airway Mallampati: II  TM Distance: >3 FB Neck ROM: Full    Dental  (+) Dental Advisory Given, Teeth Intact   Pulmonary neg pulmonary ROS,    Pulmonary exam normal breath sounds clear to auscultation       Cardiovascular negative cardio ROS Normal cardiovascular exam Rhythm:Regular Rate:Normal     Neuro/Psych negative neurological ROS  negative psych ROS   GI/Hepatic negative GI ROS, Neg liver ROS,   Endo/Other  negative endocrine ROS  Renal/GU negative Renal ROS  negative genitourinary   Musculoskeletal negative musculoskeletal ROS (+)   Abdominal   Peds  Hematology negative hematology ROS (+)   Anesthesia Other Findings   Reproductive/Obstetrics                            Anesthesia Physical Anesthesia Plan  ASA: I  Anesthesia Plan: General   Post-op Pain Management:    Induction: Intravenous  PONV Risk Score and Plan: 4 or greater and Ondansetron, Dexamethasone, Midazolam, Scopolamine patch - Pre-op and Treatment may vary due to age or medical condition  Airway Management Planned: Oral ETT  Additional Equipment: None  Intra-op Plan:   Post-operative Plan: Extubation in OR  Informed Consent: I have reviewed the patients History and Physical, chart, labs and discussed the procedure including the risks, benefits and alternatives for the proposed anesthesia with the patient or authorized representative who has indicated his/her understanding and acceptance.   Dental advisory given  Plan Discussed with: CRNA  Anesthesia Plan Comments:         Anesthesia Quick Evaluation

## 2017-05-07 ENCOUNTER — Ambulatory Visit (HOSPITAL_COMMUNITY)
Admission: RE | Admit: 2017-05-07 | Discharge: 2017-05-07 | Disposition: A | Discharge status: Home / Self Care | Payer: Managed Care, Other (non HMO) | Source: Ambulatory Visit | Attending: General Surgery | Admitting: General Surgery

## 2017-05-07 ENCOUNTER — Ambulatory Visit (HOSPITAL_COMMUNITY): Payer: Managed Care, Other (non HMO) | Admitting: Anesthesiology

## 2017-05-07 ENCOUNTER — Encounter (HOSPITAL_COMMUNITY)
Admission: RE | Disposition: A | Discharge status: Home / Self Care | Payer: Self-pay | Source: Ambulatory Visit | Attending: General Surgery

## 2017-05-07 ENCOUNTER — Encounter (HOSPITAL_COMMUNITY): Payer: Self-pay | Admitting: Certified Registered Nurse Anesthetist

## 2017-05-07 DIAGNOSIS — K8010 Calculus of gallbladder with chronic cholecystitis without obstruction: Secondary | ICD-10-CM | POA: Insufficient documentation

## 2017-05-07 HISTORY — PX: CHOLECYSTECTOMY: SHX55

## 2017-05-07 SURGERY — LAPAROSCOPIC CHOLECYSTECTOMY
Anesthesia: General | Site: Abdomen

## 2017-05-07 MED ORDER — ACETAMINOPHEN 325 MG PO TABS
650.0000 mg | ORAL_TABLET | ORAL | Status: DC | PRN
  Administered 2017-05-07: 650 mg via ORAL
  Filled 2017-05-07 (×2): qty 2

## 2017-05-07 MED ORDER — LIDOCAINE 2% (20 MG/ML) 5 ML SYRINGE
INTRAMUSCULAR | Status: AC
  Filled 2017-05-07: qty 5

## 2017-05-07 MED ORDER — LIDOCAINE 2% (20 MG/ML) 5 ML SYRINGE
INTRAMUSCULAR | Status: DC | PRN
  Administered 2017-05-07: 100 mg via INTRAVENOUS

## 2017-05-07 MED ORDER — ROCURONIUM BROMIDE 10 MG/ML (PF) SYRINGE
PREFILLED_SYRINGE | INTRAVENOUS | Status: DC | PRN
  Administered 2017-05-07: 50 mg via INTRAVENOUS

## 2017-05-07 MED ORDER — PROPOFOL 10 MG/ML IV BOLUS
INTRAVENOUS | Status: AC
  Filled 2017-05-07: qty 20

## 2017-05-07 MED ORDER — LACTATED RINGERS IV SOLN
INTRAVENOUS | Status: DC | PRN
  Administered 2017-05-07: 08:00:00 via INTRAVENOUS

## 2017-05-07 MED ORDER — PROMETHAZINE HCL 25 MG/ML IJ SOLN: 6.2500 mg | INTRAMUSCULAR | Status: DC | PRN

## 2017-05-07 MED ORDER — MORPHINE SULFATE (PF) 2 MG/ML IV SOLN: 2.0000 mg | INTRAVENOUS | Status: DC | PRN

## 2017-05-07 MED ORDER — ACETAMINOPHEN 325 MG PO TABS
ORAL_TABLET | ORAL | Status: AC
  Administered 2017-05-07: 650 mg via ORAL
  Filled 2017-05-07: qty 2

## 2017-05-07 MED ORDER — FENTANYL CITRATE (PF) 100 MCG/2ML IJ SOLN
INTRAMUSCULAR | Status: AC
  Filled 2017-05-07: qty 2

## 2017-05-07 MED ORDER — FENTANYL CITRATE (PF) 250 MCG/5ML IJ SOLN
INTRAMUSCULAR | Status: AC
  Filled 2017-05-07: qty 5

## 2017-05-07 MED ORDER — MIDAZOLAM HCL 2 MG/2ML IJ SOLN
INTRAMUSCULAR | Status: AC
  Filled 2017-05-07: qty 2

## 2017-05-07 MED ORDER — MIDAZOLAM HCL 2 MG/2ML IJ SOLN
INTRAMUSCULAR | Status: DC | PRN
  Administered 2017-05-07 (×2): 1 mg via INTRAVENOUS

## 2017-05-07 MED ORDER — ROCURONIUM BROMIDE 10 MG/ML (PF) SYRINGE
PREFILLED_SYRINGE | INTRAVENOUS | Status: AC
  Filled 2017-05-07: qty 5

## 2017-05-07 MED ORDER — SUGAMMADEX SODIUM 200 MG/2ML IV SOLN
INTRAVENOUS | Status: DC | PRN
  Administered 2017-05-07: 300 mg via INTRAVENOUS

## 2017-05-07 MED ORDER — ONDANSETRON HCL 4 MG/2ML IJ SOLN
INTRAMUSCULAR | Status: AC
  Filled 2017-05-07: qty 2

## 2017-05-07 MED ORDER — BUPIVACAINE HCL (PF) 0.25 % IJ SOLN
INTRAMUSCULAR | Status: AC
  Filled 2017-05-07: qty 30

## 2017-05-07 MED ORDER — OXYCODONE-ACETAMINOPHEN 5-325 MG PO TABS: 1.0000 | ORAL_TABLET | Freq: Four times a day (QID) | ORAL | 0 refills | Status: DC | PRN

## 2017-05-07 MED ORDER — PHENYLEPHRINE 40 MCG/ML (10ML) SYRINGE FOR IV PUSH (FOR BLOOD PRESSURE SUPPORT)
PREFILLED_SYRINGE | INTRAVENOUS | Status: AC
  Filled 2017-05-07: qty 10

## 2017-05-07 MED ORDER — DEXAMETHASONE SODIUM PHOSPHATE 10 MG/ML IJ SOLN
INTRAMUSCULAR | Status: DC | PRN
  Administered 2017-05-07: 10 mg via INTRAVENOUS

## 2017-05-07 MED ORDER — SODIUM CHLORIDE 0.9 % IR SOLN
Status: DC | PRN
  Administered 2017-05-07: 1

## 2017-05-07 MED ORDER — OXYCODONE HCL 5 MG PO TABS
ORAL_TABLET | ORAL | Status: AC
  Administered 2017-05-07: 10 mg via ORAL
  Filled 2017-05-07: qty 2

## 2017-05-07 MED ORDER — CHLORHEXIDINE GLUCONATE CLOTH 2 % EX PADS: 6.0000 | MEDICATED_PAD | Freq: Once | CUTANEOUS | Status: DC

## 2017-05-07 MED ORDER — DEXAMETHASONE SODIUM PHOSPHATE 10 MG/ML IJ SOLN
INTRAMUSCULAR | Status: AC
  Filled 2017-05-07: qty 1

## 2017-05-07 MED ORDER — ONDANSETRON HCL 4 MG/2ML IJ SOLN
INTRAMUSCULAR | Status: DC | PRN
  Administered 2017-05-07: 4 mg via INTRAVENOUS

## 2017-05-07 MED ORDER — OXYCODONE HCL 5 MG PO TABS
5.0000 mg | ORAL_TABLET | ORAL | Status: DC | PRN
  Administered 2017-05-07: 10 mg via ORAL

## 2017-05-07 MED ORDER — ACETAMINOPHEN 650 MG RE SUPP: 650.0000 mg | RECTAL | Status: DC | PRN

## 2017-05-07 MED ORDER — FENTANYL CITRATE (PF) 100 MCG/2ML IJ SOLN
25.0000 ug | INTRAMUSCULAR | Status: DC | PRN
  Administered 2017-05-07 (×2): 50 ug via INTRAVENOUS

## 2017-05-07 MED ORDER — MORPHINE SULFATE (PF) 4 MG/ML IV SOLN
INTRAVENOUS | Status: AC
  Administered 2017-05-07: 2 mg
  Filled 2017-05-07: qty 1

## 2017-05-07 MED ORDER — SODIUM CHLORIDE 0.9% FLUSH: 3.0000 mL | Freq: Two times a day (BID) | INTRAVENOUS | Status: DC

## 2017-05-07 MED ORDER — SODIUM CHLORIDE 0.9% FLUSH: 3.0000 mL | INTRAVENOUS | Status: DC | PRN

## 2017-05-07 MED ORDER — 0.9 % SODIUM CHLORIDE (POUR BTL) OPTIME
TOPICAL | Status: DC | PRN
  Administered 2017-05-07: 1000 mL

## 2017-05-07 MED ORDER — BUPIVACAINE HCL 0.25 % IJ SOLN
INTRAMUSCULAR | Status: DC | PRN
  Administered 2017-05-07: 4 mL

## 2017-05-07 MED ORDER — PROPOFOL 10 MG/ML IV BOLUS
INTRAVENOUS | Status: DC | PRN
  Administered 2017-05-07: 200 mg via INTRAVENOUS

## 2017-05-07 MED ORDER — FENTANYL CITRATE (PF) 250 MCG/5ML IJ SOLN
INTRAMUSCULAR | Status: DC | PRN
  Administered 2017-05-07 (×2): 50 ug via INTRAVENOUS

## 2017-05-07 MED ORDER — SODIUM CHLORIDE 0.9 % IV SOLN: 250.0000 mL | INTRAVENOUS | Status: DC | PRN

## 2017-05-07 SURGICAL SUPPLY — 39 items
APL SKNCLS STERI-STRIP NONHPOA (GAUZE/BANDAGES/DRESSINGS) ×1
BAG SPEC RTRVL 10 TROC 200 (ENDOMECHANICALS)
BENZOIN TINCTURE PRP APPL 2/3 (GAUZE/BANDAGES/DRESSINGS) ×3 IMPLANT
CANISTER SUCT 3000ML PPV (MISCELLANEOUS) ×3 IMPLANT
CHLORAPREP W/TINT 26ML (MISCELLANEOUS) ×3 IMPLANT
CLIP VESOLOCK MED LG 6/CT (CLIP) ×3 IMPLANT
CLOSURE WOUND 1/2 X4 (GAUZE/BANDAGES/DRESSINGS) ×1
COVER SURGICAL LIGHT HANDLE (MISCELLANEOUS) ×3 IMPLANT
COVER TRANSDUCER ULTRASND (DRAPES) ×3 IMPLANT
ELECT REM PT RETURN 9FT ADLT (ELECTROSURGICAL) ×3
ELECTRODE REM PT RTRN 9FT ADLT (ELECTROSURGICAL) ×1 IMPLANT
GAUZE SPONGE 2X2 8PLY STRL LF (GAUZE/BANDAGES/DRESSINGS) ×1 IMPLANT
GLOVE BIO SURGEON STRL SZ7.5 (GLOVE) ×3 IMPLANT
GOWN STRL REUS W/ TWL LRG LVL3 (GOWN DISPOSABLE) ×2 IMPLANT
GOWN STRL REUS W/ TWL XL LVL3 (GOWN DISPOSABLE) ×1 IMPLANT
GOWN STRL REUS W/TWL LRG LVL3 (GOWN DISPOSABLE) ×6
GOWN STRL REUS W/TWL XL LVL3 (GOWN DISPOSABLE) ×3
GRASPER SUT TROCAR 14GX15 (MISCELLANEOUS) ×3 IMPLANT
KIT BASIN OR (CUSTOM PROCEDURE TRAY) ×3 IMPLANT
KIT ROOM TURNOVER OR (KITS) ×3 IMPLANT
NDL INSUFFLATION 14GA 120MM (NEEDLE) ×1 IMPLANT
NEEDLE INSUFFLATION 14GA 120MM (NEEDLE) ×3 IMPLANT
NS IRRIG 1000ML POUR BTL (IV SOLUTION) ×3 IMPLANT
PAD ARMBOARD 7.5X6 YLW CONV (MISCELLANEOUS) ×6 IMPLANT
POUCH RETRIEVAL ECOSAC 10 (ENDOMECHANICALS) IMPLANT
POUCH RETRIEVAL ECOSAC 10MM (ENDOMECHANICALS)
SCISSORS LAP 5X35 DISP (ENDOMECHANICALS) ×3 IMPLANT
SET IRRIG TUBING LAPAROSCOPIC (IRRIGATION / IRRIGATOR) ×3 IMPLANT
SLEEVE ENDOPATH XCEL 5M (ENDOMECHANICALS) ×3 IMPLANT
SPECIMEN JAR SMALL (MISCELLANEOUS) ×3 IMPLANT
SPONGE GAUZE 2X2 STER 10/PKG (GAUZE/BANDAGES/DRESSINGS) ×2
STRIP CLOSURE SKIN 1/2X4 (GAUZE/BANDAGES/DRESSINGS) ×2 IMPLANT
SUT MNCRL AB 3-0 PS2 18 (SUTURE) ×3 IMPLANT
TOWEL OR 17X24 6PK STRL BLUE (TOWEL DISPOSABLE) ×3 IMPLANT
TOWEL OR 17X26 10 PK STRL BLUE (TOWEL DISPOSABLE) ×3 IMPLANT
TRAY LAPAROSCOPIC MC (CUSTOM PROCEDURE TRAY) ×3 IMPLANT
TROCAR XCEL NON-BLD 11X100MML (ENDOMECHANICALS) ×3 IMPLANT
TROCAR XCEL NON-BLD 5MMX100MML (ENDOMECHANICALS) ×3 IMPLANT
TUBING INSUFFLATION (TUBING) ×3 IMPLANT

## 2017-05-07 NOTE — Anesthesia Postprocedure Evaluation (Signed)
Anesthesia Post Note  Patient: Karen [name]  Procedure(s) Performed: LAPAROSCOPIC CHOLECYSTECTOMY (N/A Abdomen)     Patient location during evaluation: PACU Anesthesia Type: General Level of consciousness: awake and alert Pain management: pain level controlled Vital Signs Assessment: post-procedure vital signs reviewed and stable Respiratory status: spontaneous breathing, nonlabored ventilation and respiratory function stable Cardiovascular status: blood pressure returned to baseline and stable Postop Assessment: no apparent nausea or vomiting Anesthetic complications: no    Last Vitals:  Vitals:   05/07/17 0930 05/07/17 0945  BP: 122/85 112/79  Pulse: 91 94  Resp: 20 16  Temp:    SpO2: 100% 98%    Last Pain:  Vitals:   05/07/17 0945  TempSrc:   PainSc: Asleep                 Beryle Lathe

## 2017-05-07 NOTE — Interval H&P Note (Signed)
History and Physical Interval Note:  05/07/2017 7:25 AM  Karen [name]  has presented today for surgery, with the diagnosis of GALLSTONES   The various methods of treatment have been discussed with the patient and family. After consideration of risks, benefits and other options for treatment, the patient has consented to  Procedure(s): LAPAROSCOPIC CHOLECYSTECTOMY (N/A) as a surgical intervention .  The patient's history has been reviewed, patient examined, no change in status, stable for surgery.  I have reviewed the patient's chart and labs.  Questions were answered to the patient's satisfaction.     Marigene Ehlers., Jed Limerick

## 2017-05-07 NOTE — Op Note (Signed)
05/07/2017  9:04 AM  PATIENT:  Karen [name]  26 y.o. female  PRE-OPERATIVE DIAGNOSIS:  GALLSTONES   POST-OPERATIVE DIAGNOSIS:  CHRONIC CHOLECYSTITIS, GALLSTONES   PROCEDURE:  Procedure(s): LAPAROSCOPIC CHOLECYSTECTOMY (N/A)  SURGEON:  Surgeon(s) and Role:    * Axel Filler, MD - Primary  ANESTHESIA:   local and general  EBL:  Total I/O In: -  Out: 5 [Blood:5]  BLOOD ADMINISTERED:none  DRAINS: none   LOCAL MEDICATIONS USED:  BUPIVICAINE   SPECIMEN:  Source of Specimen:  Gallbladder  DISPOSITION OF SPECIMEN:  PATHOLOGY  COUNTS:  YES  TOURNIQUET:  * No tourniquets in log *  DICTATION: .Dragon Dictation   EBL: <5cc   Complications: none   Counts: reported as correct x 2   Findings: Chronic infma  Indications for procedure: Pt is a 26 y/o F  with RUQ pain and seen to have gallstones.   Details of the procedure: The patient was taken to the operating and placed in the supine position with bilateral SCDs in place. A time out was called and all facts were verified. A pneumoperitoneum was obtained via A Veress needle technique to a pressure of 14mm of mercury. A 5mm trochar was then placed in the right upper quadrant under visualization, and there were no injuries to any abdominal organs. A 11 mm port was then placed in the umbilical region after infiltrating with local anesthesia under direct visualization. A second epigastric port was placed under direct visualization.   The gallbladder was identified and retracted, the peritoneum was then sharply dissected from the gallbladder and this dissection was carried down to Calot's triangle. The cystic duct was identified and dissected circumferentially and seen going into the gallbladder 360.  The cystic artery was dissected away from the surrounding tissues.   The critical angle was obtained.   2 clips were placed proximally one distally and the cystic duct transected. The cystic artery was identified and 2 clips  placed proximally and one distally and transected. We then proceeded to remove the gallbladder off the hepatic fossa with Bovie cautery. A retrieval bag was then placed in the abdomen and gallbladder placed in the bag. The hepatic fossa was then reexamined and hemostasis was achieved with Bovie cautery and was excellent at this portion of the case. The subhepatic fossa and perihepatic fossa was then irrigated until the effluent was clear. The specimen bag and specimen were removed from the abdominal cavity.  The 11 mm trocar fascia was reapproximated with the Endo Close #1 Vicryl x1. The pneumoperitoneum was evacuated and all trochars removed under direct visulalization. The skin was then closed with 4-0 Monocryl and the skin dressed with Steri-Strips, gauze, and tape. The patient was awaken from general anesthesia and taken to the recovery room in stable condition.    PLAN OF CARE: Discharge to home after PACU  PATIENT DISPOSITION:  PACU - hemodynamically stable.   Delay start of Pharmacological VTE agent (>24hrs) due to surgical blood loss or risk of bleeding: not applicable

## 2017-05-07 NOTE — H&P (View-Only) (Signed)
History of Present Illness Karen Filler MD; 04/04/2017 9:50 AM) The patient is a 26 year old female who presents for evaluation of gall stones. Referred by: Dr. Tilden Fossa Chief Complaint: Abdominal pain  Patient is a 26 year old female with a right upper quadrant abdominal pain since March 2018. Patient states that the pain has subsequently come and gone on several occasions. She states it is becoming more frequent. She states it's the right upper quadrant. She states it's fairly severe when it does occur. Sharp in nature. She states that usually high fatty/acidic foods cause the pain to start. She is been staying on a for diet minimizing her fat intake. This seems to help minimally.  Patient has had a previous ultrasound. This reveals sludge and small stones. Patient's LFTs were within normal limits.    Past Surgical History Karen [name], [place]; 04/04/2017 9:43 AM) Oral Surgery   Diagnostic Studies History Karen [name], [place]; 04/04/2017 9:43 AM) Colonoscopy  never Mammogram  never Pap Smear  1-5 years ago  Allergies Karen [name], RMA; 04/04/2017 9:43 AM) No Known Allergies 04/04/2017  Medication History Karen [name], RMA; 04/04/2017 9:45 AM) Hydrocodone-Acetaminophen (5-325MG  Tablet, Oral) Active. Naproxen (  Tablet, Oral) Active. Zofran (  Tablet, Oral) Active. Oxycodone-Acetaminophen (5-325MG  Tablet, Oral) Active. Promethazine HCl (  Tablet, Oral) Active. Medications Reconciled  Pregnancy / Birth History Karen [name], [place]; 04/04/2017 9:43 AM) Age at menarche  14 years. Contraceptive History  Depo-provera. Gravida  2 Length (months) of breastfeeding  12-24 Maternal age  1-20 Para  1 Regular periods   Other Problems Karen [name], [place]; 04/04/2017 9:43 AM) No pertinent past medical history     Review of Systems Karen Filler MD; 04/04/2017 9:48 AM) General Present- Appetite Loss, Fatigue, Fever and Night Sweats.  Not Present- Chills, Weight Gain and Weight Loss. Skin Present- Dryness. Not Present- Change in Wart/Mole, Hives, Jaundice, New Lesions, Non-Healing Wounds, Rash and Ulcer. HEENT Present- Sinus Pain, Sore Throat and Wears glasses/contact lenses. Not Present- Earache, Hearing Loss, Hoarseness, Nose Bleed, Oral Ulcers, Ringing in the Ears, Seasonal Allergies, Visual Disturbances and Yellow Eyes. Respiratory Not Present- Cough and Difficulty Breathing. Breast Present- Breast Pain. Not Present- Breast Mass, Nipple Discharge and Skin Changes. Cardiovascular Not Present- Chest Pain, Difficulty Breathing Lying Down, Leg Cramps, Palpitations, Rapid Heart Rate, Shortness of Breath and Swelling of Extremities. Gastrointestinal Present- Abdominal Pain, Bloating, Change in Bowel Habits, Chronic diarrhea, Constipation, Excessive gas, Gets full quickly at meals, Indigestion, Nausea and Vomiting. Not Present- Bloody Stool, Difficulty Swallowing, Hemorrhoids and Rectal Pain. Female Genitourinary Present- Frequency, Nocturia, Painful Urination, Pelvic Pain and Urgency. Musculoskeletal Not Present- Back Pain, Joint Pain, Joint Stiffness, Muscle Pain, Muscle Weakness and Swelling of Extremities. Neurological Present- Headaches and Weakness. Not Present- Decreased Memory, Fainting, Numbness, Seizures, Tingling, Tremor and Trouble walking. Psychiatric Not Present- Anxiety, Bipolar, Change in Sleep Pattern, Depression, Fearful and Frequent crying. Endocrine Not Present- Cold Intolerance, Excessive Hunger, Hair Changes, Heat Intolerance, Hot flashes and New Diabetes. Hematology Not Present- Blood Thinners, Easy Bruising, Excessive bleeding, Gland problems, HIV and Persistent Infections. All other systems negative  Vitals Karen [name] RMA; 04/04/2017 9:45 AM) 04/04/2017 9:45 AM Weight: 160.2 lb Height: 64in Body Surface Area: 1.78 m Body Mass Index: 27.5 kg/m  Temp.: 97.55F  BP: 120/90 (Sitting, Left Arm,  Standard)       Physical Exam Karen Filler MD; 04/04/2017 9:50 AM) The physical exam findings are as follows: Note:Constitutional: No acute distress, conversant, appears stated age  Eyes: Anicteric sclerae, moist conjunctiva, no lid lag  Neck: No thyromegaly, trachea midline, no cervical lymphadenopathy  Lungs: Clear to auscultation biilaterally, normal respiratory effot  Cardiovascular: regular rate & rhythm, no murmurs, no peripheal edema, pedal pulses 2+  GI: Soft, no masses or hepatosplenomegaly, non-tender to palpation  MSK: Normal gait, no clubbing cyanosis, edema  Skin: No rashes, palpation reveals normal skin turgor  Psychiatric: Appropriate judgment and insight, oriented to person, place, and time    Assessment & Plan Karen Filler MD; 04/04/2017 9:50 AM) SYMPTOMATIC CHOLELITHIASIS (K80.20) Impression: 26 year old female symptomatic cholelithiasis  1. We will proceed to the operating room for a laparoscopic cholecystectomy  2. Risks and benefits were discussed with the patient to generally include, but not limited to: infection, bleeding, possible need for post op ERCP, damage to the bile ducts, bile leak, and possible need for further surgery. Alternatives were offered and described. All questions were answered and the patient voiced understanding of the procedure and wishes to proceed at this point with a laparoscopic cholecystectomy

## 2017-05-07 NOTE — Transfer of Care (Signed)
Immediate Anesthesia Transfer of Care Note  Patient: Karen [name]  Procedure(s) Performed: LAPAROSCOPIC CHOLECYSTECTOMY (N/A Abdomen)  Patient Location: PACU  Anesthesia Type:General  Level of Consciousness: drowsy and patient cooperative  Airway & Oxygen Therapy: Patient Spontanous Breathing  Post-op Assessment: Report given to RN, Post -op Vital signs reviewed and stable and Patient moving all extremities X 4  Post vital signs: Reviewed and stable  Last Vitals:  Vitals:   05/07/17 0708  BP: (!) 148/72  Pulse: 91  Resp: 18  Temp: 36.8 C  SpO2: 100%    Last Pain:  Vitals:   05/07/17 0708  TempSrc: Oral      Patients Stated Pain Goal: 2 (05/07/17 0705)  Complications: No apparent anesthesia complications

## 2017-05-07 NOTE — Anesthesia Procedure Notes (Signed)
Procedure Name: Intubation Date/Time: 05/07/2017 8:33 AM Performed by: Julieta Bellini Pre-anesthesia Checklist: Patient identified, Emergency Drugs available, Suction available and Patient being monitored Patient Re-evaluated:Patient Re-evaluated prior to induction Oxygen Delivery Method: Circle system utilized Preoxygenation: Pre-oxygenation with 100% oxygen Induction Type: IV induction Ventilation: Mask ventilation without difficulty Laryngoscope Size: Mac and 3 Grade View: Grade I Tube type: Oral Tube size: 7.0 mm Number of attempts: 1 Airway Equipment and Method: Stylet Placement Confirmation: ETT inserted through vocal cords under direct vision,  positive ETCO2 and breath sounds checked- equal and bilateral Secured at: 21 cm Tube secured with: Tape Dental Injury: Teeth and Oropharynx as per pre-operative assessment

## 2017-05-07 NOTE — Discharge Instructions (Signed)
[facility] ______[facility] °LAPAROSCOPIC SURGERY: POST OP INSTRUCTIONS °Always review your discharge instruction sheet given to you by the facility where your surgery was performed. °IF YOU HAVE DISABILITY OR FAMILY LEAVE FORMS, YOU MUST BRING THEM TO THE OFFICE FOR PROCESSING.   °DO NOT GIVE THEM TO YOUR DOCTOR. ° °1. A prescription for pain medication may be given to you upon discharge.  Take your pain medication as prescribed, if needed.  If narcotic pain medicine is not needed, then you may take acetaminophen (Tylenol) or ibuprofen (Advil) as needed. °2. Take your usually prescribed medications unless otherwise directed. °3. If you need a refill on your pain medication, please contact your pharmacy.  They will contact our office to request authorization. Prescriptions will not be filled after 5pm or on week-ends. °4. You should follow a light diet the first few days after arrival home, such as soup and crackers, etc.  Be sure to include lots of fluids daily. °5. Most patients will experience some swelling and bruising in the area of the incisions.  Ice packs will help.  Swelling and bruising can take several days to resolve.  °6. It is common to experience some constipation if taking pain medication after surgery.  Increasing fluid intake and taking a stool softener (such as Colace) will usually help or prevent this problem from occurring.  A mild laxative (Milk of Magnesia or Miralax) should be taken according to package instructions if there are no bowel movements after 48 hours. °7. Unless discharge instructions indicate otherwise, you may remove your bandages 24-48 hours after surgery, and you may shower at that time.  You may have steri-strips (small skin tapes) in place directly over the incision.  These strips should be left on the skin for 7-10 days.  If your surgeon used skin glue on the incision, you may shower in 24 hours.  The glue will flake off over the next 2-3 weeks.  Any sutures or  staples will be removed at the office during your follow-up visit. °8. ACTIVITIES:  You may resume regular (light) daily activities beginning the next day--such as daily self-care, walking, climbing stairs--gradually increasing activities as tolerated.  You may have sexual intercourse when it is comfortable.  Refrain from any heavy lifting or straining until approved by your doctor. °a. You may drive when you are no longer taking prescription pain medication, you can comfortably wear a seatbelt, and you can safely maneuver your car and apply brakes. °b. RETURN TO WORK:  __________________________________________________________ °9. You should see your doctor in the office for a follow-up appointment approximately 2-3 weeks after your surgery.  Make sure that you call for this appointment within a day or two after you arrive home to insure a convenient appointment time. °10. OTHER INSTRUCTIONS: __________________________________________________________________________________________________________________________ __________________________________________________________________________________________________________________________ °WHEN TO CALL YOUR DOCTOR: °1. Fever over 101.0 °2. Inability to urinate °3. Continued bleeding from incision. °4. Increased pain, redness, or drainage from the incision. °5. Increasing abdominal pain ° °The clinic staff is available to answer your questions during regular business hours.  Please don’t hesitate to call and ask to speak to one of the nurses for clinical concerns.  If you have a medical emergency, go to the nearest emergency room or call 911.  A surgeon from [facility] is always on call at the hospital. °[address], [address], [place], [place]  [address] ? [address] [address], [place], [place]   [address] °[phone] ? [phone] ? FAX [phone] °Web site:   [email or web address] °

## 2017-05-08 ENCOUNTER — Encounter (HOSPITAL_COMMUNITY): Payer: Self-pay | Admitting: General Surgery

## 2017-09-28 IMAGING — US US ABDOMEN LIMITED
1 series · 14 of 25 positions shown · non-contrast
Comparison: None.

CLINICAL DATA: Initial evaluation for acute right-sided abdominal
pain, nausea, vomiting.

EXAM:
US ABDOMEN LIMITED - RIGHT UPPER QUADRANT

[Series 1: us abdomen limited · 0.19mm/px · 14 of 51 slices shown]
[im 1/51]
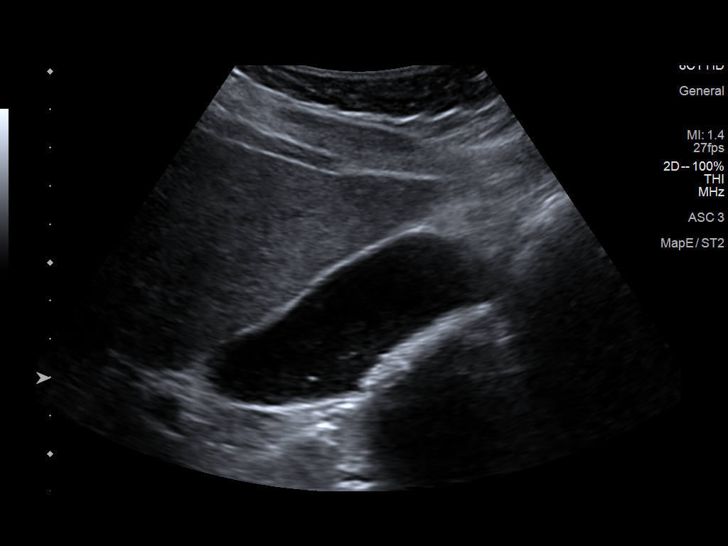
[im 5/51]
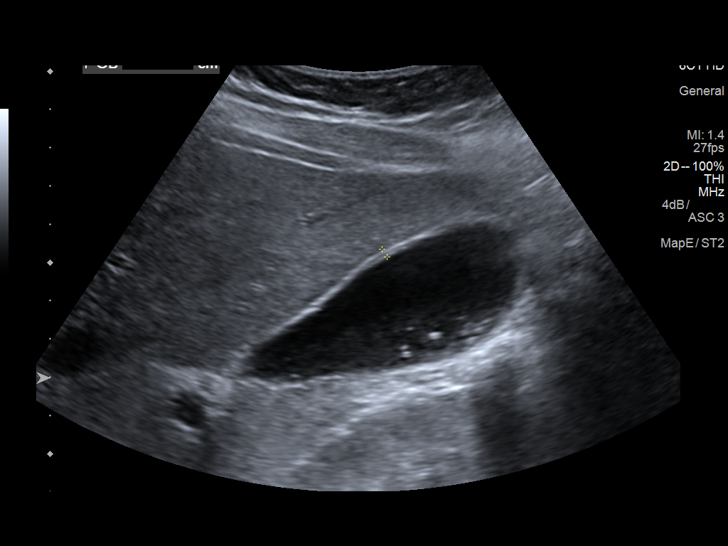
[im 9/51]
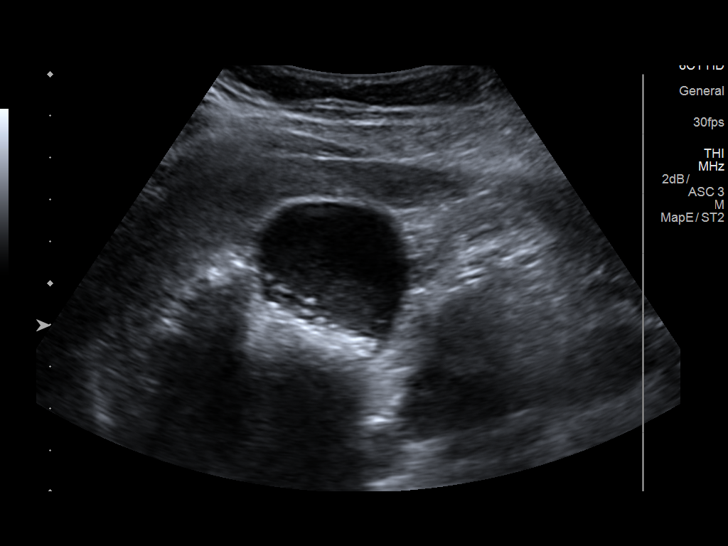
[im 13/51]
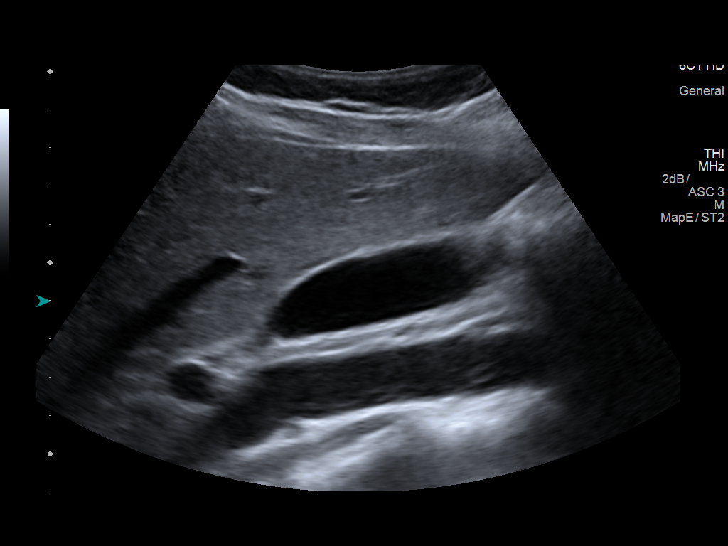
[im 17/51]
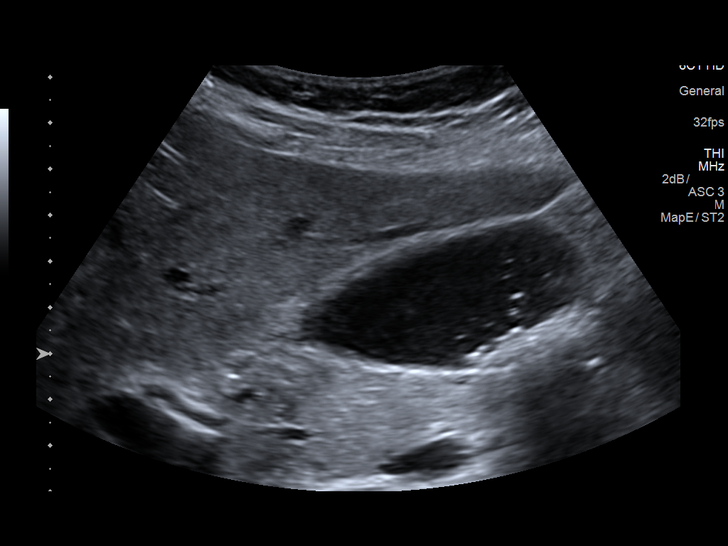
[im 19/51]
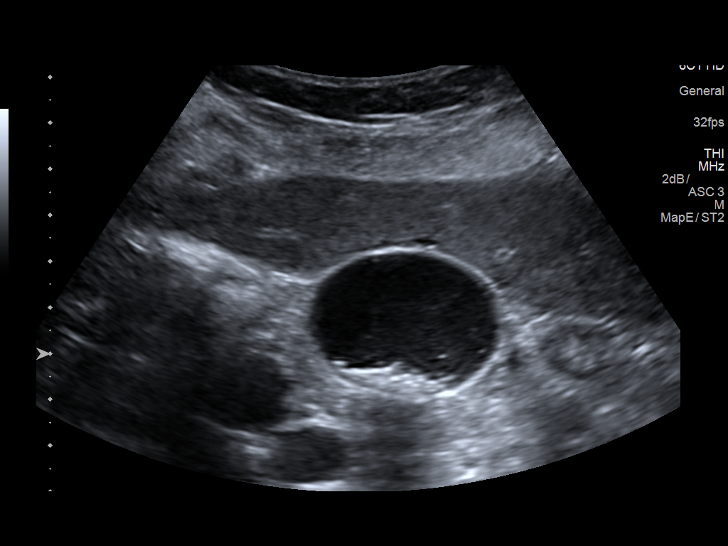
[im 23/51]
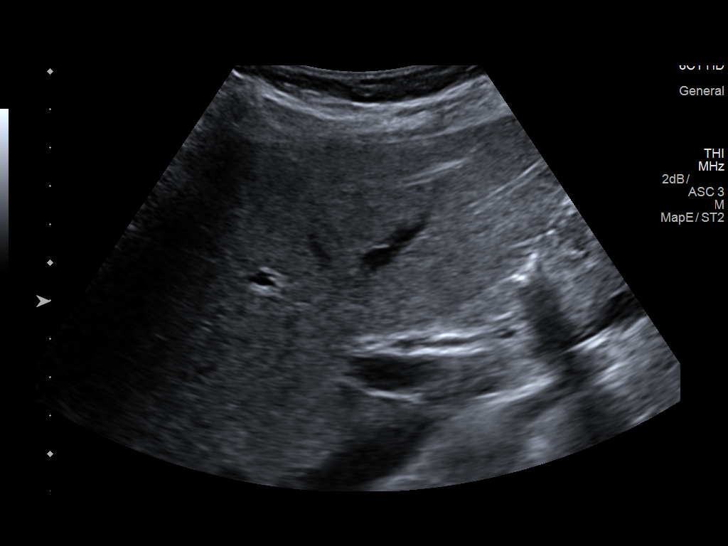
[im 28/51]
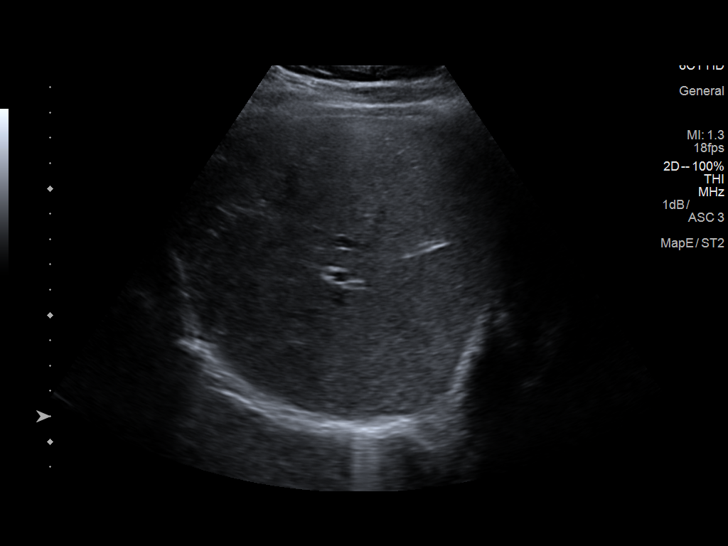
[im 32/51]
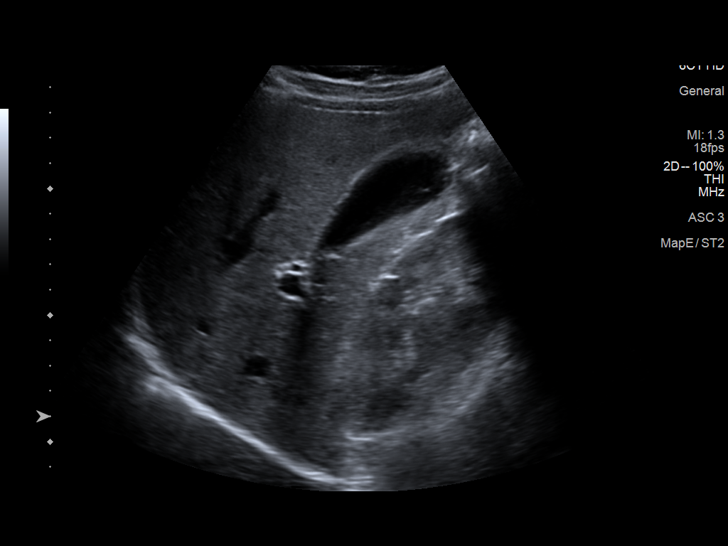
[im 34/51]
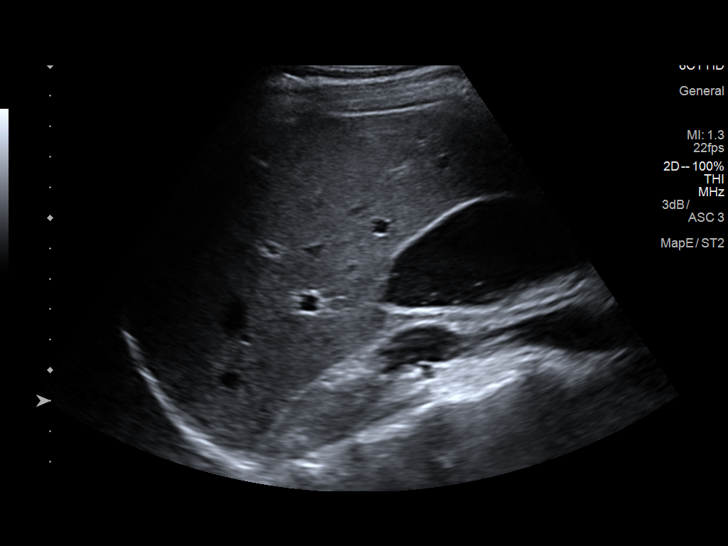
[im 38/51]
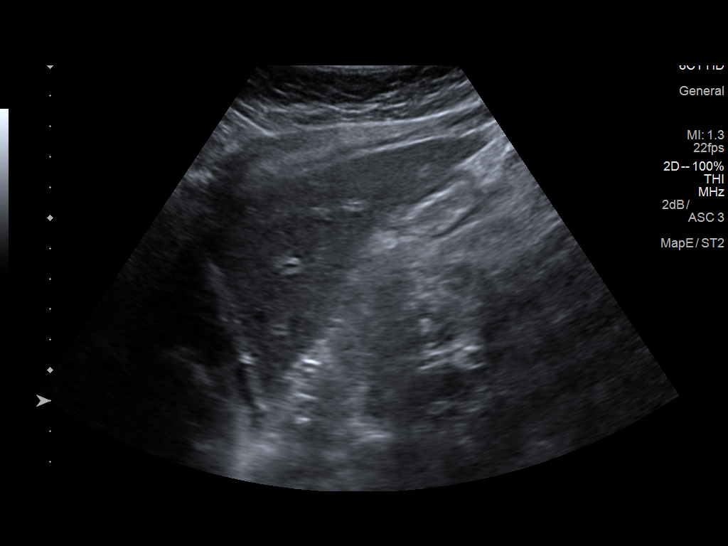
[im 42/51]
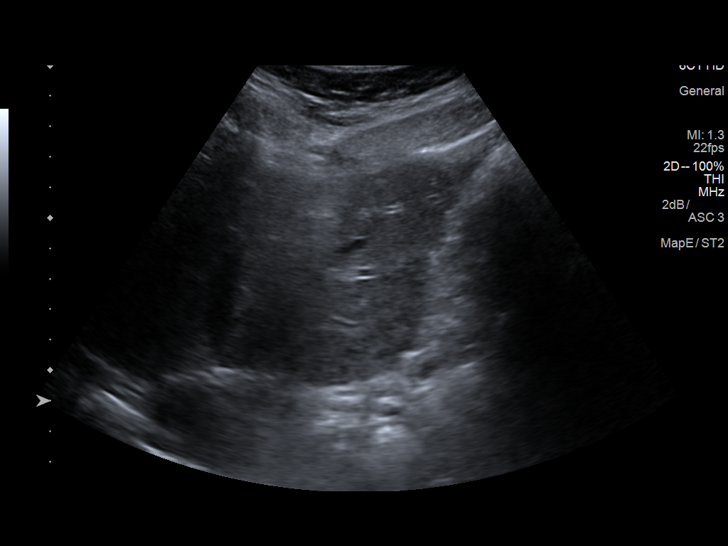
[im 46/51]
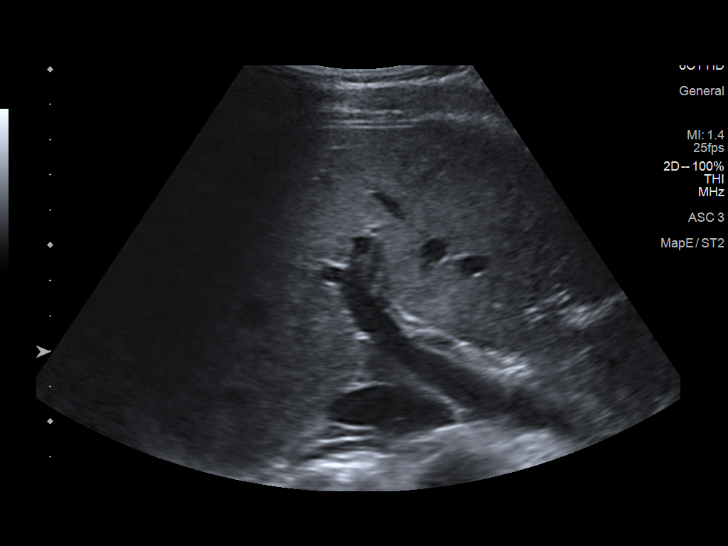
[im 51/51]
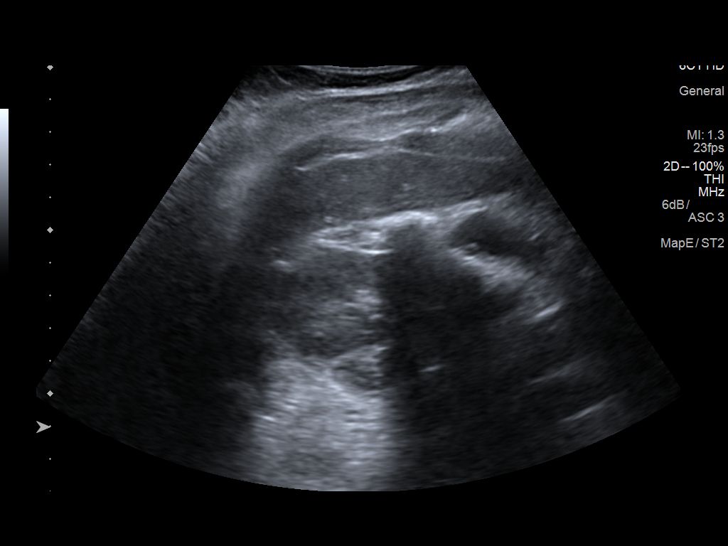

[14 of 25 positions shown; findings below may reference images not displayed]

FINDINGS: Gallbladder:

Stones and sludge present within the gallbladder lumen. Largest
stone measured approximately 5-6 mm. Gallbladder wall measure within
normal limits at 2.4 mm. No free pericholecystic fluid. No
sonographic Murphy sign elicited on exam.

Common bile duct:

Diameter: 2.6 mm

Liver:

No focal lesion identified. Within normal limits in parenchymal
echogenicity.
IMPRESSION: 1. Stones and sludge within the gallbladder lumen. No other
sonographic features to suggest acute cholecystitis.
2. No biliary dilatation.

## 2018-01-18 IMAGING — US US ABDOMEN COMPLETE
1 series · 14 of 25 positions shown · non-contrast
Comparison: None.

CLINICAL DATA: Right upper quadrant pain

EXAM:
ABDOMEN ULTRASOUND COMPLETE

[Series 1: us abdomen complete · 0.20mm/px · 14 of 64 slices shown]
[im 1/64]
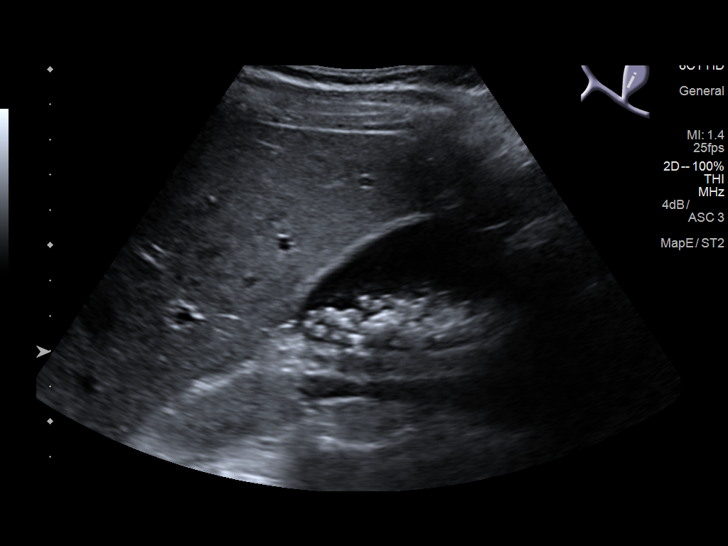
[im 6/64]
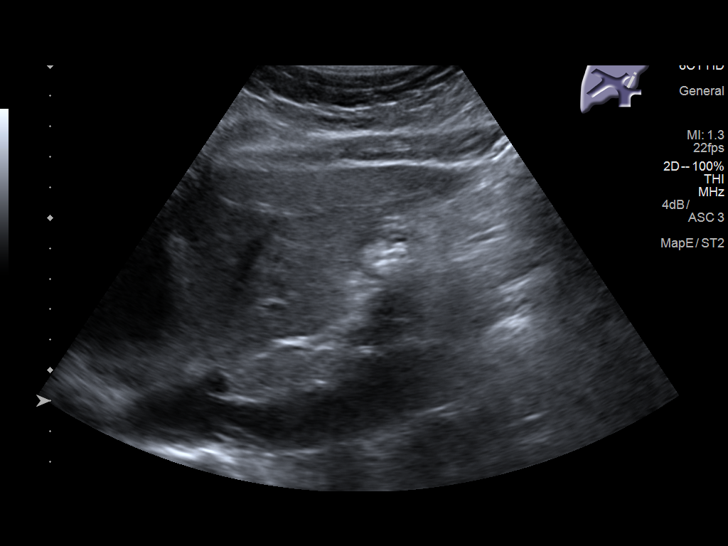
[im 11/64]
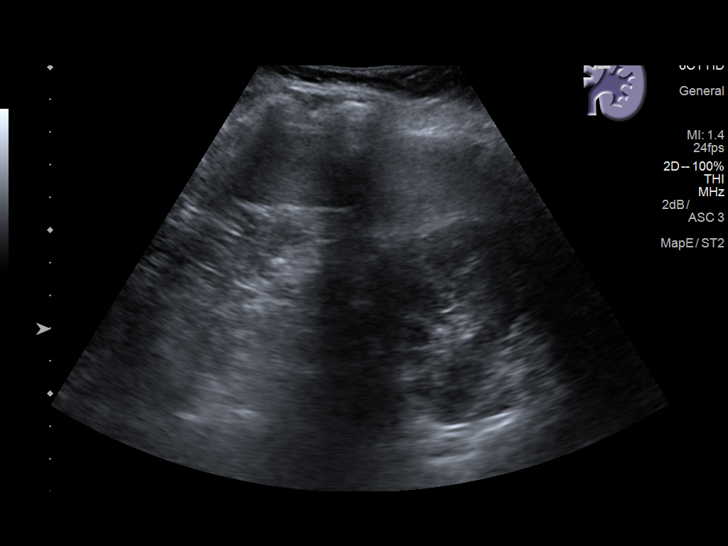
[im 16/64]
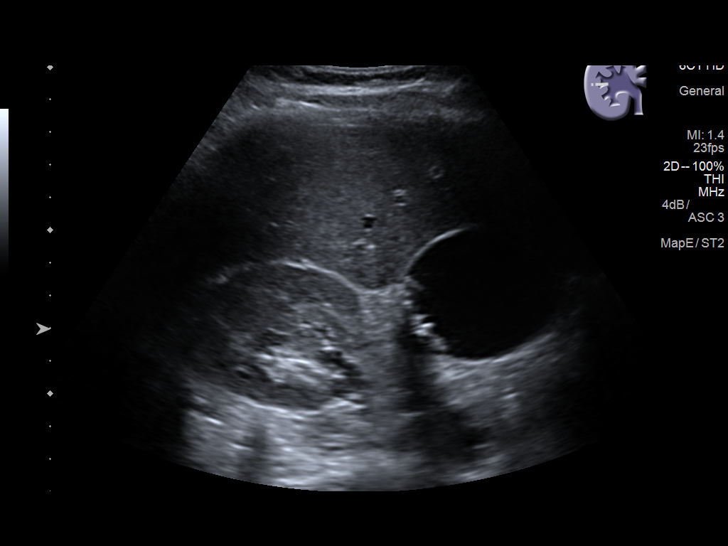
[im 22/64]
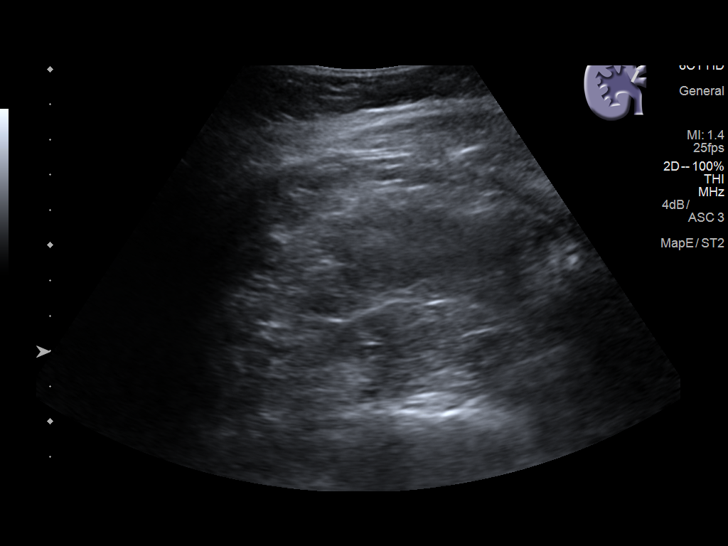
[im 24/64]
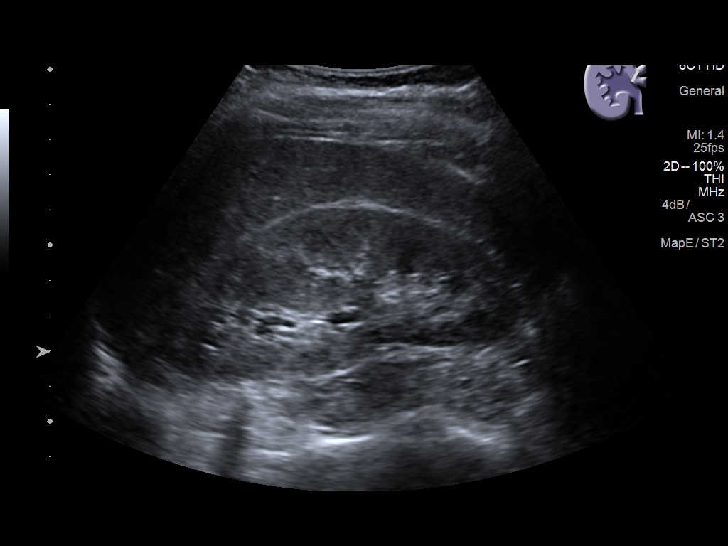
[im 29/64]
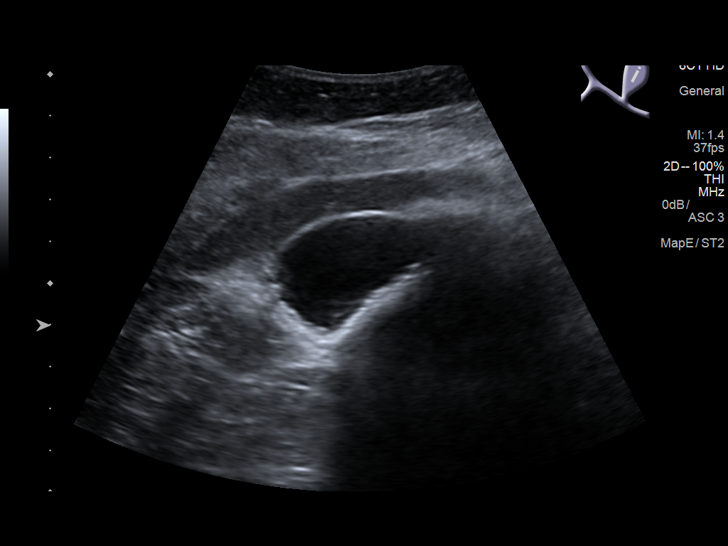
[im 35/64]
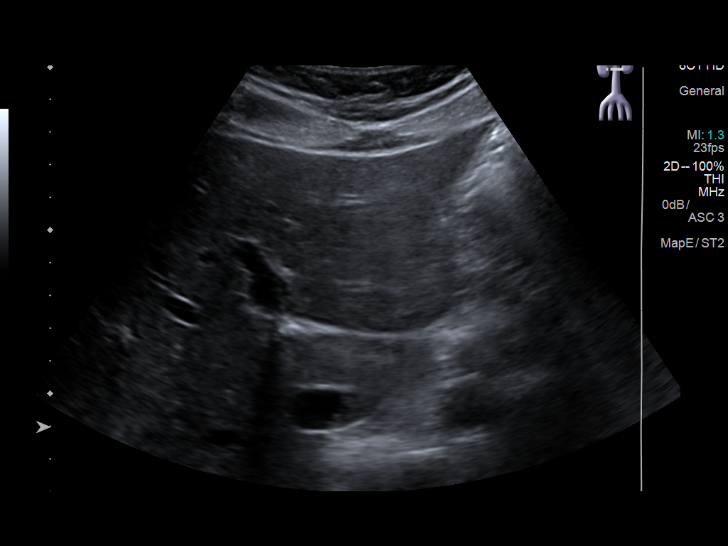
[im 40/64]
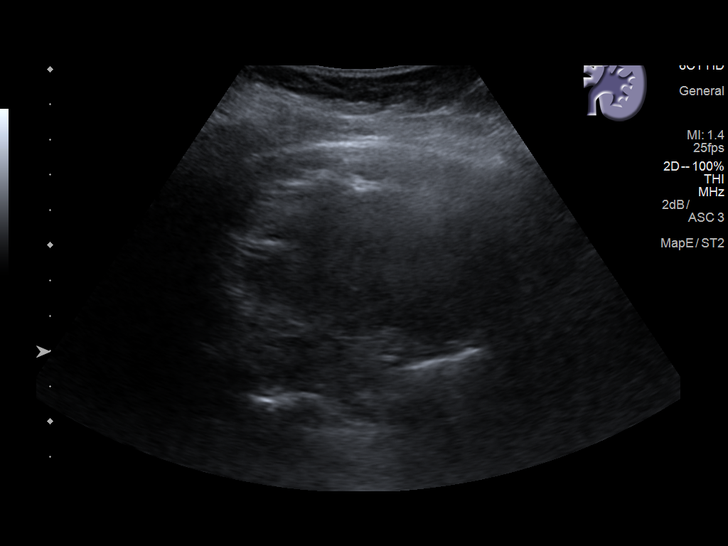
[im 43/64]
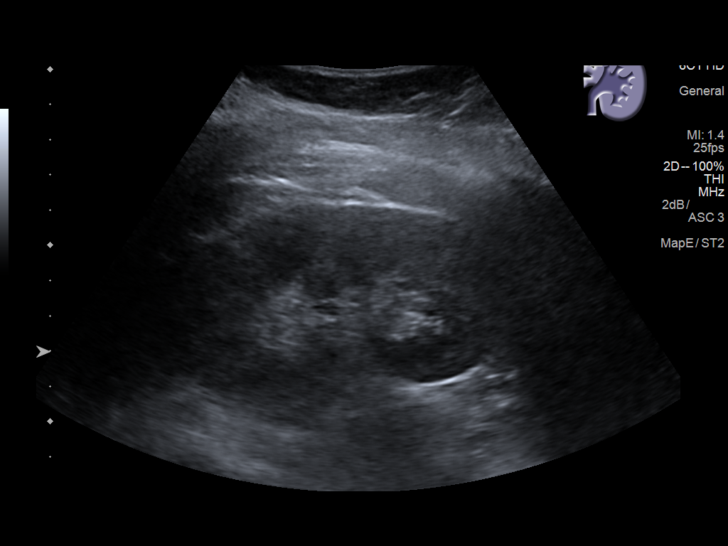
[im 48/64]
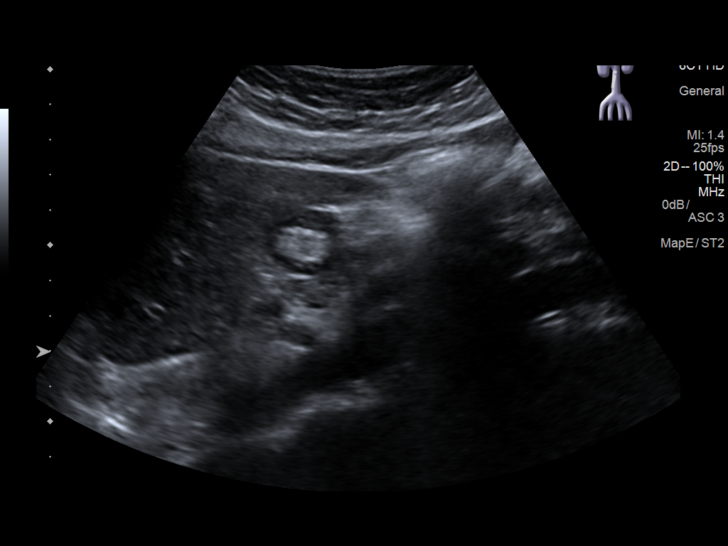
[im 53/64]
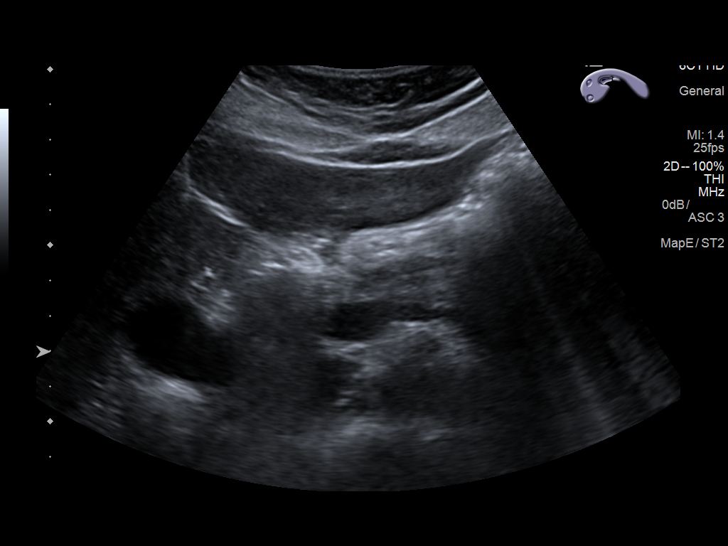
[im 58/64]
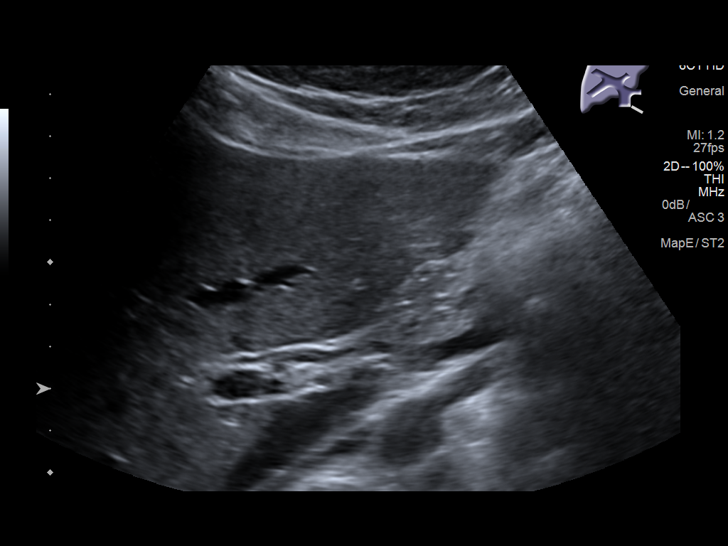
[im 64/64]
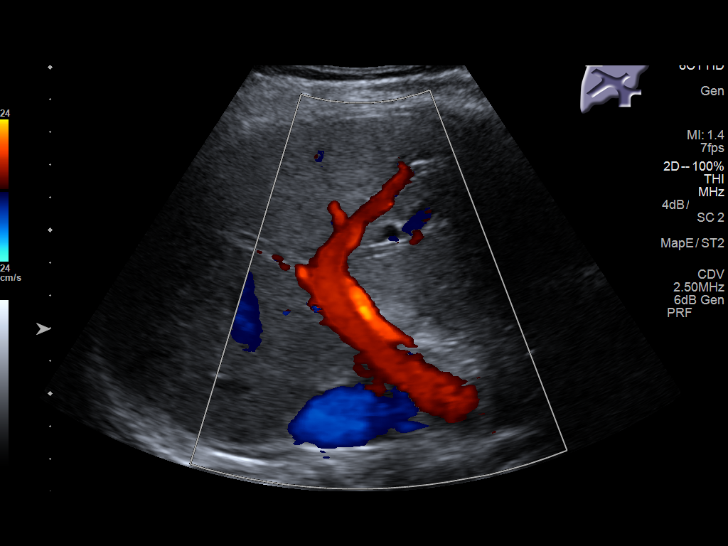

[14 of 25 positions shown; findings below may reference images not displayed]

FINDINGS: Gallbladder: Numerous small stones and sludge within the
gallbladder. The stones or scratched at the stones are 4 mm or less
in size. No wall thickening.

Common bile duct: Diameter: Normal caliber, 4 mm

Liver: No focal lesion identified. Within normal limits in
parenchymal echogenicity.

IVC: No abnormality visualized.

Pancreas: Visualized portion unremarkable.

Spleen: Size and appearance within normal limits.

Right Kidney: Length: 10.6 cm. Echogenicity within normal limits. No
mass or hydronephrosis visualized.

Left Kidney: Length: 10.4 cm. Echogenicity within normal limits. No
mass or hydronephrosis visualized.

Abdominal aorta: No aneurysm visualized.

Other findings: None.
IMPRESSION: Cholelithiasis.  No sonographic evidence of acute cholecystitis.

## 2018-02-21 ENCOUNTER — Encounter (INDEPENDENT_AMBULATORY_CARE_PROVIDER_SITE_OTHER): Payer: Managed Care, Other (non HMO) | Admitting: Podiatry

## 2018-02-21 NOTE — Progress Notes (Signed)
This encounter was created in error - please disregard.

## 2018-04-09 LAB — OB RESULTS CONSOLE GC/CHLAMYDIA
Chlamydia: NEGATIVE
Gonorrhea: NEGATIVE

## 2018-04-09 LAB — OB RESULTS CONSOLE ANTIBODY SCREEN: Antibody Screen: NEGATIVE

## 2018-04-09 LAB — OB RESULTS CONSOLE HIV ANTIBODY (ROUTINE TESTING): HIV: NONREACTIVE

## 2018-04-09 LAB — OB RESULTS CONSOLE ABO/RH: RH TYPE: NEGATIVE

## 2018-04-09 LAB — OB RESULTS CONSOLE RUBELLA ANTIBODY, IGM: Rubella: IMMUNE

## 2018-04-09 LAB — OB RESULTS CONSOLE HEPATITIS B SURFACE ANTIGEN: HEP B S AG: NEGATIVE

## 2018-04-09 LAB — OB RESULTS CONSOLE RPR: RPR: NONREACTIVE

## 2018-07-28 ENCOUNTER — Inpatient Hospital Stay (HOSPITAL_COMMUNITY)
Admission: AD | Admit: 2018-07-28 | Payer: Managed Care, Other (non HMO) | Source: Home / Self Care | Admitting: Obstetrics and Gynecology

## 2018-07-29 NOTE — L&D Delivery Note (Addendum)
Delivery Note Pt 9.5cm dilated with uncontrollable urge to push. With first few pushes, lip reduced. Pt pushed in various positions over and at 2:34 PM a viable female was delivered via Vaginal, Spontaneous (Presentation: OP;  ).  APGAR: 8,9 ; weight pending  .   Placenta status: delivered intact, schultz, .  Cord: 3vc  with the following complications:none .  Cord pH:n/a  Anesthesia:  none Episiotomy: None Lacerations: 2nd degree Suture Repair: 2.0 vicryl and 4-0 vicryl Est. Blood Loss (mL):   Mom to postpartum.  Baby to Couplet care / Skin to Skin. Desires circumcision in office  Karen [name] 11/04/2018, 3:08 PM

## 2018-09-29 ENCOUNTER — Ambulatory Visit: Payer: Managed Care, Other (non HMO) | Admitting: Cardiovascular Disease

## 2018-09-30 ENCOUNTER — Encounter: Payer: Self-pay | Admitting: *Deleted

## 2018-10-13 LAB — OB RESULTS CONSOLE GBS: GBS: NEGATIVE

## 2018-10-26 ENCOUNTER — Telehealth (HOSPITAL_COMMUNITY): Payer: Self-pay | Admitting: *Deleted

## 2018-10-26 ENCOUNTER — Encounter (HOSPITAL_COMMUNITY): Payer: Self-pay | Admitting: *Deleted

## 2018-10-26 NOTE — Telephone Encounter (Signed)
Preadmission screen  

## 2018-11-02 ENCOUNTER — Encounter (HOSPITAL_COMMUNITY): Payer: Self-pay | Admitting: *Deleted

## 2018-11-03 ENCOUNTER — Other Ambulatory Visit (HOSPITAL_COMMUNITY): Payer: Self-pay | Admitting: *Deleted

## 2018-11-03 NOTE — H&P (Signed)
Karen [name] is a 28 y.o.G3P1001 female presenting at 5639 6/7wks for elective iol at term with favorable cervix. Pt is dated per LMP which was confirmed with a 10 week US. Her pregnancy was complicated with palpitations which self resolved - declined cardiology consult. Pt has had decreasing plts but > 100K. She received rhogam. She is GBS negative. She declined genetic testing OB History    Gravida  3   Para  1   Term  0   Preterm  1   AB  1   Living  1     SAB  0   TAB  1   Ectopic  0   Multiple  0   Live Births  1          Past Medical History:  Diagnosis Date  . Benign gestational thrombocytopenia (HCC)   . Gallstones   . Medical history non-contributory    Past Surgical History:  Procedure Laterality Date  . CHOLECYSTECTOMY N/A 05/07/2017   Procedure: LAPAROSCOPIC CHOLECYSTECTOMY;  Surgeon: Axel Filleramirez, Armando, MD;  [facility];  Service: General;  Laterality: N/A;  . INDUCED ABORTION    . WISDOM TOOTH EXTRACTION     Family History: family history includes Heart failure in her mother. Social History:  reports that she has never smoked. She has never used smokeless tobacco. She reports that she does not drink alcohol or use drugs.     Maternal Diabetes: No Genetic Screening: Declined Maternal Ultrasounds/Referrals: Normal Fetal Ultrasounds or other Referrals:  None Maternal Substance Abuse:  No Significant Maternal Medications:  None Significant Maternal Lab Results:  Lab values include: Group B Strep negative Other Comments:  None  Review of Systems  Constitutional: Positive for malaise/fatigue. Negative for chills, fever and weight loss.  Eyes: Negative for blurred vision.  Respiratory: Negative for shortness of breath.   Cardiovascular: Negative for chest pain, palpitations and leg swelling.  Gastrointestinal: Negative for abdominal pain, heartburn, nausea and vomiting.  Genitourinary: Negative for dysuria and urgency.  Musculoskeletal: Positive  for myalgias.  Skin: Negative for itching and rash.   Maternal Medical History:  Reason for admission: Nausea. Elective iol  Contractions: Onset was more than 2 days ago.   Frequency: rare.   Perceived severity is mild.    Fetal activity: Perceived fetal activity is normal.   Last perceived fetal movement was within the past hour.    Prenatal complications: Thrombocytopenia.   Prenatal Complications - Diabetes: none.      currently breastfeeding. Maternal Exam:  Uterine Assessment: Contraction strength is mild.  Contraction frequency is rare.   Abdomen: Patient reports generalized tenderness.  Estimated fetal weight is AGA.   Fetal presentation: vertex  Introitus: Normal vulva. Vulva is negative for condylomata and lesion.  Normal vagina.  Vagina is negative for condylomata and discharge.  Pelvis: adequate for delivery.   Cervix: Cervix evaluated by digital exam.     Physical Exam  Constitutional: She is oriented to person, place, and time. She appears well-developed and well-nourished.  Neck: Normal range of motion.  Cardiovascular: Normal rate.  Respiratory: Effort normal.  GI: There is generalized abdominal tenderness.  Genitourinary:    Vulva, vagina and uterus normal.     No vulval condylomata or lesion noted.     No vaginal discharge.   Musculoskeletal: Normal range of motion.  Neurological: She is alert and oriented to person, place, and time.  Skin: Skin is warm.  Psychiatric: She has a normal mood and affect.  Her behavior is normal. Judgment and thought content normal.    Prenatal labs: ABO, Rh: B/Negative/-- (09/12 0000) Antibody: Negative (09/12 0000) Rubella: Immune (09/12 0000) RPR: Nonreactive (09/12 0000)  HBsAg: Negative (09/12 0000)  HIV: Non-reactive (09/12 0000)  GBS: Negative (03/17 0000)   Assessment/Plan: 91MB [id number] presenting at 52 6/[redacted]wks gestation for elective iol at term  Favorable bishops score GBS neg Plan to induce with  pitocin then AROM Pain control prn Anticipate svd    Karen [name] 11/03/2018, 5:18 PM

## 2018-11-04 ENCOUNTER — Inpatient Hospital Stay (HOSPITAL_COMMUNITY): Payer: Managed Care, Other (non HMO)

## 2018-11-04 ENCOUNTER — Inpatient Hospital Stay (HOSPITAL_COMMUNITY)
Admission: AD | Admit: 2018-11-04 | Discharge: 2018-11-05 | DRG: 807 | Disposition: A | Discharge status: Home / Self Care | Payer: Managed Care, Other (non HMO) | Attending: Obstetrics and Gynecology | Admitting: Obstetrics and Gynecology

## 2018-11-04 ENCOUNTER — Encounter (HOSPITAL_COMMUNITY): Payer: Self-pay

## 2018-11-04 DIAGNOSIS — Z3A39 39 weeks gestation of pregnancy: Secondary | ICD-10-CM | POA: Diagnosis not present

## 2018-11-04 DIAGNOSIS — O26893 Other specified pregnancy related conditions, third trimester: Secondary | ICD-10-CM | POA: Diagnosis present

## 2018-11-04 DIAGNOSIS — O9912 Other diseases of the blood and blood-forming organs and certain disorders involving the immune mechanism complicating childbirth: Principal | ICD-10-CM | POA: Diagnosis present

## 2018-11-04 DIAGNOSIS — D6959 Other secondary thrombocytopenia: Secondary | ICD-10-CM | POA: Diagnosis present

## 2018-11-04 DIAGNOSIS — Z3490 Encounter for supervision of normal pregnancy, unspecified, unspecified trimester: Secondary | ICD-10-CM

## 2018-11-04 LAB — RPR: RPR Ser Ql: NONREACTIVE

## 2018-11-04 LAB — TYPE AND SCREEN
ABO/RH(D): B NEG
Antibody Screen: NEGATIVE

## 2018-11-04 LAB — ABO/RH: ABO/RH(D): B NEG

## 2018-11-04 LAB — CBC
HCT: 39.2 % (ref 36.0–46.0)
Hemoglobin: 11.9 g/dL — ABNORMAL LOW (ref 12.0–15.0)
MCH: 25.4 pg — ABNORMAL LOW (ref 26.0–34.0)
MCHC: 30.4 g/dL (ref 30.0–36.0)
MCV: 83.8 fL (ref 80.0–100.0)
Platelets: 100 10*3/uL — ABNORMAL LOW (ref 150–400)
RBC: 4.68 MIL/uL (ref 3.87–5.11)
RDW: 14.4 % (ref 11.5–15.5)
WBC: 6.7 10*3/uL (ref 4.0–10.5)
nRBC: 0 % (ref 0.0–0.2)

## 2018-11-04 MED ORDER — SENNOSIDES-DOCUSATE SODIUM 8.6-50 MG PO TABS
2.0000 | ORAL_TABLET | ORAL | Status: DC
  Administered 2018-11-04: 2 via ORAL
  Filled 2018-11-04: qty 2

## 2018-11-04 MED ORDER — IBUPROFEN 600 MG PO TABS
600.0000 mg | ORAL_TABLET | Freq: Four times a day (QID) | ORAL | Status: DC
  Administered 2018-11-04 – 2018-11-05 (×4): 600 mg via ORAL
  Filled 2018-11-04 (×4): qty 1

## 2018-11-04 MED ORDER — ACETAMINOPHEN 325 MG PO TABS: 650.0000 mg | ORAL_TABLET | ORAL | Status: DC | PRN

## 2018-11-04 MED ORDER — SOD CITRATE-CITRIC ACID 500-334 MG/5ML PO SOLN: 30.0000 mL | ORAL | Status: DC | PRN

## 2018-11-04 MED ORDER — LIDOCAINE HCL (PF) 1 % IJ SOLN
30.0000 mL | INTRAMUSCULAR | Status: AC | PRN
  Administered 2018-11-04: 30 mL via SUBCUTANEOUS
  Filled 2018-11-04: qty 30

## 2018-11-04 MED ORDER — OXYCODONE-ACETAMINOPHEN 5-325 MG PO TABS: 2.0000 | ORAL_TABLET | ORAL | Status: DC | PRN

## 2018-11-04 MED ORDER — COCONUT OIL OIL: 1.0000 "application " | TOPICAL_OIL | Status: DC | PRN

## 2018-11-04 MED ORDER — OXYTOCIN 40 UNITS IN NORMAL SALINE INFUSION - SIMPLE MED
INTRAVENOUS | Status: AC
  Filled 2018-11-04: qty 1000

## 2018-11-04 MED ORDER — SIMETHICONE 80 MG PO CHEW: 80.0000 mg | CHEWABLE_TABLET | ORAL | Status: DC | PRN

## 2018-11-04 MED ORDER — OXYCODONE HCL 5 MG PO TABS: 5.0000 mg | ORAL_TABLET | ORAL | Status: DC | PRN

## 2018-11-04 MED ORDER — OXYCODONE-ACETAMINOPHEN 5-325 MG PO TABS: 1.0000 | ORAL_TABLET | ORAL | Status: DC | PRN

## 2018-11-04 MED ORDER — OXYTOCIN BOLUS FROM INFUSION
500.0000 mL | Freq: Once | INTRAVENOUS | Status: AC
  Administered 2018-11-04: 500 mL via INTRAVENOUS

## 2018-11-04 MED ORDER — PRENATAL MULTIVITAMIN CH
1.0000 | ORAL_TABLET | Freq: Every day | ORAL | Status: DC
  Administered 2018-11-05: 1 via ORAL
  Filled 2018-11-04: qty 1

## 2018-11-04 MED ORDER — DIBUCAINE (PERIANAL) 1 % EX OINT: 1.0000 "application " | TOPICAL_OINTMENT | CUTANEOUS | Status: DC | PRN

## 2018-11-04 MED ORDER — LACTATED RINGERS IV SOLN: 500.0000 mL | INTRAVENOUS | Status: DC | PRN

## 2018-11-04 MED ORDER — ONDANSETRON HCL 4 MG/2ML IJ SOLN: 4.0000 mg | INTRAMUSCULAR | Status: DC | PRN

## 2018-11-04 MED ORDER — DIPHENHYDRAMINE HCL 25 MG PO CAPS: 25.0000 mg | ORAL_CAPSULE | Freq: Four times a day (QID) | ORAL | Status: DC | PRN

## 2018-11-04 MED ORDER — ONDANSETRON HCL 4 MG PO TABS: 4.0000 mg | ORAL_TABLET | ORAL | Status: DC | PRN

## 2018-11-04 MED ORDER — BENZOCAINE-MENTHOL 20-0.5 % EX AERO: 1.0000 "application " | INHALATION_SPRAY | CUTANEOUS | Status: DC | PRN

## 2018-11-04 MED ORDER — ACETAMINOPHEN 325 MG PO TABS
650.0000 mg | ORAL_TABLET | ORAL | Status: DC | PRN
  Administered 2018-11-04 – 2018-11-05 (×2): 650 mg via ORAL
  Filled 2018-11-04 (×2): qty 2

## 2018-11-04 MED ORDER — ZOLPIDEM TARTRATE 5 MG PO TABS: 5.0000 mg | ORAL_TABLET | Freq: Every evening | ORAL | Status: DC | PRN

## 2018-11-04 MED ORDER — LACTATED RINGERS IV SOLN
INTRAVENOUS | Status: DC
  Administered 2018-11-04 (×2): via INTRAVENOUS

## 2018-11-04 MED ORDER — TERBUTALINE SULFATE 1 MG/ML IJ SOLN: 0.2500 mg | Freq: Once | INTRAMUSCULAR | Status: DC | PRN

## 2018-11-04 MED ORDER — OXYCODONE HCL 5 MG PO TABS: 10.0000 mg | ORAL_TABLET | ORAL | Status: DC | PRN

## 2018-11-04 MED ORDER — OXYTOCIN 40 UNITS IN NORMAL SALINE INFUSION - SIMPLE MED
1.0000 m[IU]/min | INTRAVENOUS | Status: DC
  Administered 2018-11-04: 2 m[IU]/min via INTRAVENOUS

## 2018-11-04 MED ORDER — WITCH HAZEL-GLYCERIN EX PADS: 1.0000 "application " | MEDICATED_PAD | CUTANEOUS | Status: DC | PRN

## 2018-11-04 MED ORDER — BUTORPHANOL TARTRATE 1 MG/ML IJ SOLN
1.0000 mg | INTRAMUSCULAR | Status: DC | PRN
  Administered 2018-11-04 (×2): 1 mg via INTRAVENOUS
  Filled 2018-11-04 (×2): qty 1

## 2018-11-04 MED ORDER — OXYTOCIN 40 UNITS IN NORMAL SALINE INFUSION - SIMPLE MED
2.5000 [IU]/h | INTRAVENOUS | Status: DC
  Administered 2018-11-04: 2.5 [IU]/h via INTRAVENOUS

## 2018-11-04 MED ORDER — TETANUS-DIPHTH-ACELL PERTUSSIS 5-2.5-18.5 LF-MCG/0.5 IM SUSP
0.5000 mL | Freq: Once | INTRAMUSCULAR | Status: AC
  Administered 2018-11-05: 0.5 mL via INTRAMUSCULAR
  Filled 2018-11-04: qty 0.5

## 2018-11-04 MED ORDER — ONDANSETRON HCL 4 MG/2ML IJ SOLN: 4.0000 mg | Freq: Four times a day (QID) | INTRAMUSCULAR | Status: DC | PRN

## 2018-11-04 NOTE — Progress Notes (Signed)
Patient ID: Karen [name], female   DOB: 1990-09-16, 28 y.o.   MRN: [id number] Pt with increased pelvic pressure and pain with contractions VSS EFM - cat 1, 135; movements associated with changes in strip TOCO - contractions q 3-4 mins SVE - 5/80/-2; pit at  A/P: G3P1011 progressing in labor on pitocin         Pain medicine prn         Anticipate svd

## 2018-11-04 NOTE — Progress Notes (Signed)
Patient ID: Karen [name], female   DOB: [birth year], 28 y.o.   MRN: [id number] EFM - 120s, occ earlys, mod variability, cat 1 TOCO - ctxs q 1-2 mins SVE - 8cm  Plan - continue with expectant mgmt

## 2018-11-04 NOTE — Progress Notes (Addendum)
Patient ID: Karen [name], female   DOB: [birth year], 28 y.o.   MRN: [id number] Pt is doing well with no complaints. Aware of contractions but tolerating with no issues. +Fms VSS EFM - cat 1, 135 TOCO - contractions q 3-56mins; pitocin at SVE - 4/80/-2  A/P: G3P1011 at 14 6/7wks doing well with pitocin iol         AROM performed with no comps - clear         Expectant mgmt

## 2018-11-04 NOTE — Progress Notes (Signed)
Patient ID: Karen [name], female   DOB: 10/31/1990, 28 y.o.   MRN: [id number] Pt requests stadol Cervix now 5.5cm dil Cat 1, 130  Continue with iol with pitocin

## 2018-11-05 ENCOUNTER — Inpatient Hospital Stay (HOSPITAL_COMMUNITY): Payer: Managed Care, Other (non HMO)

## 2018-11-05 LAB — CBC
HCT: 31 % — ABNORMAL LOW (ref 36.0–46.0)
Hemoglobin: 9.9 g/dL — ABNORMAL LOW (ref 12.0–15.0)
MCH: 25.5 pg — ABNORMAL LOW (ref 26.0–34.0)
MCHC: 31.9 g/dL (ref 30.0–36.0)
MCV: 79.9 fL — ABNORMAL LOW (ref 80.0–100.0)
Platelets: 96 10*3/uL — ABNORMAL LOW (ref 150–400)
RBC: 3.88 MIL/uL (ref 3.87–5.11)
RDW: 14.4 % (ref 11.5–15.5)
WBC: 11.4 10*3/uL — ABNORMAL HIGH (ref 4.0–10.5)
nRBC: 0 % (ref 0.0–0.2)

## 2018-11-05 MED ORDER — IBUPROFEN 600 MG PO TABS: 600.0000 mg | ORAL_TABLET | Freq: Four times a day (QID) | ORAL | 0 refills | Status: AC

## 2018-11-05 MED ORDER — ACETAMINOPHEN 325 MG PO TABS: 650.0000 mg | ORAL_TABLET | ORAL | 0 refills | Status: AC | PRN

## 2018-11-05 MED ORDER — RHO D IMMUNE GLOBULIN 1500 UNIT/2ML IJ SOSY
300.0000 ug | PREFILLED_SYRINGE | Freq: Once | INTRAMUSCULAR | Status: AC
  Administered 2018-11-05: 300 ug via INTRAVENOUS
  Filled 2018-11-05: qty 2

## 2018-11-05 NOTE — Progress Notes (Addendum)
Post Partum Day 1  Subjective: no complaints, up ad lib and tolerating PO Bleeding normal.  Would like early d/c  Objective: Blood pressure 113/71, pulse 87, temperature 97.7 F (36.5 C), temperature source Oral, resp. rate 16, height 5\' 4"  (1.626 m), weight 84.6 kg, SpO2 100 %, unknown if currently breastfeeding.  Physical Exam:  General: alert and cooperative Lochia: appropriate Uterine Fundus: firm  Recent Labs    11/04/18 0641 11/05/18 0609  HGB 11.9* 9.9*  HCT 39.2 31.0*    Assessment/Plan: Discharge home  Platelets stable at 96K and d/w pt trying to alternate ibuprofen with tylenol for first week.     LOS: 1 day   Karen [name] 11/05/2018, 10:24 AM

## 2018-11-05 NOTE — Discharge Summary (Signed)
OB Discharge Summary     Patient Name: Karen [name] DOB: 07/11/1991 MRN: [id number]  Date of admission: 11/04/2018 Delivering MD: Pryor OchoaBANGA, CECILIA [facility]   Date of discharge: 11/05/2018  Admitting diagnosis: pregnancy Intrauterine pregnancy: 7122w6d     Secondary diagnosis:  Active Problems:   Term pregnancy   SVD (spontaneous vaginal delivery)   Postpartum care following vaginal delivery Gestational thrombocytopenia      Discharge diagnosis: Term Pregnancy Delivered                                                                                                Post partum procedures:none  Augmentation: AROM and Pitocin  Complications: None  Hospital course:  Induction of Labor With Vaginal Delivery   28 y.o. yo [id number] at 722w6d was admitted to the hospital 11/04/2018 for induction of labor.  Indication for induction: Favorable cervix at term.  Patient had an uncomplicated labor course as follows: Membrane Rupture Time/Date: 9:05 AM ,11/04/2018   Intrapartum Procedures: Episiotomy: None [1]                                         Lacerations:  2nd degree [3]  Patient had delivery of a Viable infant.  Information for the patient's newborn:  Alean RinneWright, Boy Lynessa [[id number]][[id number]]  Delivery Method: Vaginal, Spontaneous(Filed from Delivery Summary)   11/04/2018  Details of delivery can be found in separate delivery note.  Patient had a routine postpartum course. Patient is discharged home 11/05/18.  Physical exam  Vitals:   11/04/18 1712 11/04/18 2156 11/05/18 0054 11/05/18 0510  BP: 124/70 113/70 123/74 113/71  Pulse: 89 93 99 87  Resp: 16 18  16   Temp: 98.3 F (36.8 C) 98.2 F (36.8 C) 98.3 F (36.8 C) 97.7 F (36.5 C)  TempSrc:  Oral Oral Oral  SpO2: 98% 100% 100% 100%  Weight:      Height:       General: alert and cooperative Lochia: appropriate Uterine Fundus: firm  Labs: Lab Results  Component Value Date   WBC 11.4 (H) 11/05/2018   HGB 9.9 (L) 11/05/2018   HCT 31.0 (L)  11/05/2018   MCV 79.9 (L) 11/05/2018   PLT 96 (L) 11/05/2018   CMP Latest Ref Rng & Units 05/05/2017  Glucose 65 - 99 mg/dL 130(Q106(H)  BUN 6 - 20 mg/dL 14  Creatinine 6.570.44 - 8.461.00 mg/dL 9.620.82  Sodium 952135 - 841145 mmol/L 136  Potassium 3.5 - 5.1 mmol/L 3.6  Chloride 101 - 111 mmol/L 103  CO2 22 - 32 mmol/L 26  Calcium 8.9 - 10.3 mg/dL 9.1  Total Protein 6.5 - 8.1 g/dL -  Total Bilirubin 0.3 - 1.2 mg/dL -  Alkaline Phos 38 - 324126 U/L -  AST 15 - 41 U/L -  ALT 14 - 54 U/L -    Discharge instruction: per After Visit Summary and "Baby and Me Booklet".  After visit meds:  Allergies as of 11/05/2018   No Known Allergies  Medication List    STOP taking these medications   calcium carbonate 500 MG chewable tablet Commonly known as:  TUMS - dosed in mg elemental calcium     TAKE these medications   acetaminophen 325 MG tablet Commonly known as:  Tylenol Take 2 tablets (650 mg total) by mouth every 4 (four) hours as needed (for pain scale < 4).   ibuprofen 600 MG tablet Commonly known as:  ADVIL,MOTRIN Take 1 tablet (600 mg total) by mouth every 6 (six) hours.   prenatal multivitamin Tabs tablet Take 1 tablet by mouth daily at 12 noon.       Diet: routine diet  Activity: Advance as tolerated. Pelvic rest for 6 weeks.   Outpatient follow up:6 weeks Follow up Appt:No future appointments. Follow up Visit:No follow-ups on file.  Postpartum contraception: Depo Provera  Newborn Data: Live born female  Birth Weight: 7 lb 15.3 oz (3610 g) APGAR: 8, 9  Newborn Delivery   Birth date/time:  11/04/2018 14:34:00 Delivery type:  Vaginal, Spontaneous     Baby Feeding: Bottle Disposition:home with mother  Plans circumcision in office   11/05/2018 Oliver Pila, MD

## 2018-11-05 NOTE — Progress Notes (Signed)
Spoke with Dr. Senaida Ores in person notifying her of patient's platelet level and inquiring about continuing to provide ibuprofen. Dr. Senaida Ores verified that Ibuprofen may continue to be given. Earl Gala, Linda Hedges [place]

## 2018-11-06 LAB — RH IG WORKUP (INCLUDES ABO/RH)
ABO/RH(D): B NEG
Fetal Screen: NEGATIVE
Gestational Age(Wks): 39
Unit division: 0

## 2022-09-26 ENCOUNTER — Emergency Department (HOSPITAL_COMMUNITY)
Admission: EM | Admit: 2022-09-26 | Discharge: 2022-09-26 | Disposition: A | Discharge status: Home / Self Care | Payer: Managed Care, Other (non HMO) | Attending: Emergency Medicine | Admitting: Emergency Medicine

## 2022-09-26 ENCOUNTER — Encounter (HOSPITAL_COMMUNITY): Payer: Self-pay

## 2022-09-26 ENCOUNTER — Other Ambulatory Visit: Payer: Self-pay

## 2022-09-26 DIAGNOSIS — S0181XA Laceration without foreign body of other part of head, initial encounter: Secondary | ICD-10-CM | POA: Diagnosis not present

## 2022-09-26 DIAGNOSIS — S0990XA Unspecified injury of head, initial encounter: Secondary | ICD-10-CM | POA: Diagnosis present

## 2022-09-26 DIAGNOSIS — Z23 Encounter for immunization: Secondary | ICD-10-CM | POA: Insufficient documentation

## 2022-09-26 MED ORDER — LIDOCAINE-EPINEPHRINE (PF) 2 %-1:200000 IJ SOLN
10.0000 mL | Freq: Once | INTRAMUSCULAR | Status: AC
  Administered 2022-09-26: 10 mL via INTRADERMAL
  Filled 2022-09-26: qty 20

## 2022-09-26 MED ORDER — TETANUS-DIPHTH-ACELL PERTUSSIS 5-2.5-18.5 LF-MCG/0.5 IM SUSY
0.5000 mL | PREFILLED_SYRINGE | Freq: Once | INTRAMUSCULAR | Status: AC
  Administered 2022-09-26: 0.5 mL via INTRAMUSCULAR
  Filled 2022-09-26: qty 0.5

## 2022-09-26 MED ORDER — LIDOCAINE-EPINEPHRINE-TETRACAINE (LET) TOPICAL GEL
3.0000 mL | Freq: Once | TOPICAL | Status: AC
  Administered 2022-09-26: 3 mL via TOPICAL
  Filled 2022-09-26: qty 3
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
## 2022-09-26 NOTE — Discharge Instructions (Signed)
I recommend you use Motrin and Tylenol for headache, please have the stitches removed in 5 to 7 days.  You may do this at your primary care doctor.  Look out for signs of infection including redness, swelling and purulent discharge, if you have a severe or worsening headache please return to the ED for evaluation.
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
## 2022-10-03 ENCOUNTER — Encounter (HOSPITAL_COMMUNITY): Payer: Self-pay | Admitting: *Deleted

## 2022-10-03 ENCOUNTER — Ambulatory Visit (HOSPITAL_COMMUNITY)
Admission: EM | Admit: 2022-10-03 | Discharge: 2022-10-03 | Disposition: A | Discharge status: Home / Self Care | Payer: Managed Care, Other (non HMO)

## 2022-10-03 DIAGNOSIS — S0181XD Laceration without foreign body of other part of head, subsequent encounter: Secondary | ICD-10-CM

## 2022-10-03 DIAGNOSIS — S01119D Laceration without foreign body of unspecified eyelid and periocular area, subsequent encounter: Secondary | ICD-10-CM | POA: Diagnosis not present

## 2022-10-03 DIAGNOSIS — Z4802 Encounter for removal of sutures: Secondary | ICD-10-CM | POA: Diagnosis not present

## 2022-10-03 NOTE — ED Triage Notes (Signed)
Pt states she is here for suture removal (6) in her forehead, there is two location.

## 2022-10-03 NOTE — ED Provider Notes (Signed)
[facility]    CSN: [id number] Arrival date & time: 10/03/22  0807      History   Chief Complaint Chief Complaint  Patient presents with   Suture / Staple Removal    HPI Karen [name] is a 32 y.o. female.   Pt here for suture removal.  Pt had sutures 1 week ago.  No complaints   The history is provided by the patient. No language interpreter was used.  Suture / Staple Removal This is a new problem. The problem occurs constantly. Nothing aggravates the symptoms. Nothing relieves the symptoms. She has tried nothing for the symptoms. The treatment provided no relief.    Past Medical History:  Diagnosis Date   Benign gestational thrombocytopenia ([place])    Gallstones    Medical history non-contributory    SVD (spontaneous vaginal delivery) 11/04/2018    Patient Active Problem List   Diagnosis Date Noted   Term pregnancy 11/04/2018   SVD (spontaneous vaginal delivery) 11/04/2018   Postpartum care following vaginal delivery 11/04/2018   Pregnancy 10/14/2013    Past Surgical History:  Procedure Laterality Date   CHOLECYSTECTOMY N/A 05/07/2017   Procedure: LAPAROSCOPIC CHOLECYSTECTOMY;  Surgeon: Ralene Ok, MD;  Location: [facility];  Service: General;  Laterality: N/A;   INDUCED ABORTION     WISDOM TOOTH EXTRACTION      OB History     Gravida  3   Para  2   Term  1   Preterm  1   AB  1   Living  2      SAB  0   IAB  1   Ectopic  0   Multiple  0   Live Births  2            Home Medications    Prior to Admission medications   Medication Sig Start Date End Date Taking? Authorizing Provider  acetaminophen (TYLENOL) 325 MG tablet Take 2 tablets (650 mg total) by mouth every 4 (four) hours as needed (for pain scale < 4). 11/05/18   Paula Compton, MD  ibuprofen (ADVIL,MOTRIN) 600 MG tablet Take 1 tablet (600 mg total) by mouth every 6 (six) hours. 11/05/18   Paula Compton, MD  Prenatal Vit-Fe Fumarate-FA (PRENATAL MULTIVITAMIN)  TABS tablet Take 1 tablet by mouth daily at 12 noon.    [provider]    Family History Family History  Problem Relation Age of Onset   Heart failure Mother     Social History Social History   Tobacco Use   Smoking status: Never   Smokeless tobacco: Never  Vaping Use   Vaping Use: Never used  Substance Use Topics   Alcohol use: No    Alcohol/week: 0.0 standard drinks of alcohol   Drug use: No     Allergies   Patient has no known allergies.   Review of Systems Review of Systems  All other systems reviewed and are negative.    Physical Exam Triage Vital Signs ED Triage Vitals  Enc Vitals Group     BP 10/03/22 0852 (!) 143/91     Pulse Rate 10/03/22 0852 75     Resp 10/03/22 0852 18     Temp 10/03/22 0852 98.3 F (36.8 C)     Temp Source 10/03/22 0852 Oral     SpO2 10/03/22 0852 99 %     Weight --      Height --      Head Circumference --  Peak Flow --      Pain Score 10/03/22 0850 0     Pain Loc --      Pain Edu? --      Excl. in [place]? --    No data found.  Updated Vital Signs BP (!) 143/91 (BP Location: Left Arm)   Pulse 75   Temp 98.3 F (36.8 C) (Oral)   Resp 18   LMP 09/22/2022 (Approximate)   SpO2 99%   Visual Acuity Right Eye Distance:   Left Eye Distance:   Bilateral Distance:    Right Eye Near:   Left Eye Near:    Bilateral Near:     Physical Exam Vitals reviewed.  Constitutional:      Appearance: Normal appearance.  HENT:     Head: Normocephalic and atraumatic.     Comments: 2 sutured lacerations forehead.  Healing well    Nose: Nose normal.     Mouth/Throat:     Mouth: Mucous membranes are moist.  Neurological:     Mental Status: She is alert.  Psychiatric:        Mood and Affect: Mood normal.      UC Treatments / Results  Labs (all labs ordered are listed, but only abnormal results are displayed) Labs Reviewed - No data to display  EKG   Radiology No results found.  Procedures Procedures  (including critical care time)  Medications Ordered in UC Medications - No data to display  Initial Impression / Assessment and Plan / UC Course  I have reviewed the triage vital signs and the nursing notes.  Pertinent labs & imaging results that were available during my care of the patient were reviewed by me and considered in my medical decision making (see chart for details).     Final Clinical Impressions(s) / UC Diagnoses   Final diagnoses:  Visit for suture removal   Discharge Instructions   None    ED Prescriptions   None    PDMP not reviewed this encounter. An After Visit Summary was printed and given to the patient.       Fransico Meadow, [place] 10/03/22 [id number]

## 2024-05-24 ENCOUNTER — Emergency Department
Admission: EM | Admit: 2024-05-24 | Discharge: 2024-05-24 | Disposition: A | Discharge status: Home / Self Care | Attending: Emergency Medicine | Admitting: Emergency Medicine

## 2024-05-24 ENCOUNTER — Ambulatory Visit
Admission: RE | Admit: 2024-05-24 | Discharge: 2024-05-24 | Disposition: A | Discharge status: Home / Self Care | Attending: Emergency Medicine | Admitting: Emergency Medicine

## 2024-05-24 ENCOUNTER — Other Ambulatory Visit: Payer: Self-pay

## 2024-05-24 ENCOUNTER — Emergency Department

## 2024-05-24 VITALS — BP 133/85 | HR 100 | Temp 97.9°F | Resp 18

## 2024-05-24 DIAGNOSIS — R079 Chest pain, unspecified: Secondary | ICD-10-CM | POA: Insufficient documentation

## 2024-05-24 LAB — TROPONIN I (HIGH SENSITIVITY): Troponin I (High Sensitivity): <2 ng/L (ref <18)

## 2024-05-24 LAB — CBC
HCT: 45 % (ref 36.0–46.0)
Hemoglobin: 14.6 g/dL (ref 12.0–15.0)
MCH: 27.9 pg (ref 26.0–34.0)
MCHC: 32.4 g/dL (ref 30.0–36.0)
MCV: 86 fL (ref 80.0–100.0)
Platelets: 173 K/uL (ref 150–400)
RBC: 5.23 MIL/uL — ABNORMAL HIGH (ref 3.87–5.11)
RDW: 12.3 % (ref 11.5–15.5)
WBC: 7.3 K/uL (ref 4.0–10.5)
nRBC: 0 % (ref 0.0–0.2)

## 2024-05-24 LAB — BASIC METABOLIC PANEL WITH GFR
Anion gap: 11 (ref 5–15)
BUN: 11 mg/dL (ref 6–20)
CO2: 24 mmol/L (ref 22–32)
Calcium: 9.1 mg/dL (ref 8.9–10.3)
Chloride: 103 mmol/L (ref 98–111)
Creatinine, Ser: 0.79 mg/dL (ref 0.44–1.00)
GFR, Estimated: >60 mL/min (ref >60)
Glucose, Bld: 104 mg/dL — ABNORMAL HIGH (ref 70–99)
Potassium: 4.5 mmol/L (ref 3.5–5.1)
Sodium: 138 mmol/L (ref 135–145)

## 2024-05-24 LAB — HCG, QUANTITATIVE, PREGNANCY: hCG, Beta Chain, Quant, S: 1 m[IU]/mL (ref <5)

## 2024-05-24 NOTE — ED Triage Notes (Signed)
 Patient to Urgent Care with complaints of centralized, chest tightness. No SHOB/ no radiation.   Symptoms x4 days. Woke up this morning without symptoms but then returned around 10am.   No hx.

## 2024-05-24 NOTE — Discharge Instructions (Signed)
Go to the emergency department for evaluation of your chest pain.   

## 2024-05-24 NOTE — ED Provider Notes (Addendum)
 [facility] Provider Note    Event Date/Time   First MD Initiated Contact with Patient 05/24/24 1919     (approximate)   History   Chief Complaint Chest Pain   HPI  Karen [name] is a 33 y.o. female with past medical history of gallstones who presents to the ED complaining of chest pain.  Patient reports that she has had about 2 days of persistent tightness in her chest that seem to get better last night before returning this morning.  Pain does not seem to be exacerbated or alleviated by anything in particular and she denies any associated fevers, cough, or difficulty breathing.  She has not had any pain or swelling in her legs, denies any worsening pain with a deep breath.  She denies any recent heavy lifting and has not had any trauma to her chest.     Physical Exam   Triage Vital Signs: ED Triage Vitals  Encounter Vitals Group     BP 05/24/24 1853 134/84     Girls Systolic BP Percentile --      Girls Diastolic BP Percentile --      Boys Systolic BP Percentile --      Boys Diastolic BP Percentile --      Pulse Rate 05/24/24 1853 84     Resp 05/24/24 1853 16     Temp 05/24/24 1853 98.4 F (36.9 C)     Temp Source 05/24/24 1853 Oral     SpO2 05/24/24 1853 100 %     Weight 05/24/24 1853 179 lb (81.2 kg)     Height 05/24/24 1853 5' 4 (1.626 m)     Head Circumference --      Peak Flow --      Pain Score 05/24/24 1902 8     Pain Loc --      Pain Education --      Exclude from Growth Chart --     Most recent vital signs: Vitals:   05/24/24 1853  BP: 134/84  Pulse: 84  Resp: 16  Temp: 98.4 F (36.9 C)  SpO2: 100%    Constitutional: Alert and oriented. Eyes: Conjunctivae are normal. Head: Atraumatic. Nose: No congestion/rhinnorhea. Mouth/Throat: Mucous membranes are moist.  Cardiovascular: Normal rate, regular rhythm. Grossly normal heart sounds.  2+ radial pulses bilaterally. Respiratory: Normal respiratory effort.  No retractions.  Lungs CTAB.  No chest wall tenderness to palpation. Gastrointestinal: Soft and nontender. No distention. Musculoskeletal: No lower extremity tenderness nor edema.  Neurologic:  Normal speech and language. No gross focal neurologic deficits are appreciated.    ED Results / Procedures / Treatments   Labs (all labs ordered are listed, but only abnormal results are displayed) Labs Reviewed  BASIC METABOLIC PANEL WITH GFR - Abnormal; Notable for the following components:      Result Value   Glucose, Bld 104 (*)    All other components within normal limits  CBC - Abnormal; Notable for the following components:   RBC 5.23 (*)    All other components within normal limits  HCG, QUANTITATIVE, PREGNANCY  POC URINE PREG, ED  TROPONIN I (HIGH SENSITIVITY)     EKG  ED ECG REPORT I, Carlin Palin, the attending physician, personally viewed and interpreted this ECG.   Date: 05/24/2024  EKG Time: 18:51  Rate: 80  Rhythm: normal sinus rhythm  Axis: Normal  Intervals:none  ST&T Change: None  RADIOLOGY Chest x-ray reviewed and interpreted by me with no infiltrate,  edema, or effusion.  PROCEDURES:  Critical Care performed: No  Procedures   MEDICATIONS ORDERED IN ED: Medications - No data to display   IMPRESSION / MDM / ASSESSMENT AND PLAN / ED COURSE  I reviewed the triage vital signs and the nursing notes.                              33 y.o. female with past medical history of gallstones who presents to the ED complaining of chest tightness over the past couple of days.  Patient's presentation is most consistent with acute presentation with potential threat to life or bodily function.  Differential diagnosis includes, but is not limited to, ACS, PE, pneumonia, pneumothorax, musculoskeletal pain, GERD, anxiety.  Patient well-appearing and in no acute distress, vital signs are unremarkable.  EKG shows no evidence of arrhythmia or ischemia and symptoms seem atypical for ACS.   Troponin within normal limits and I doubt ACS or PE.  Chest x-ray also unremarkable and labs without significant anemia, leukocytosis, electrolyte abnormality, or AKI.  Patient states there is no chance she could be pregnant as her last period ended yesterday.  With reassuring workup, no apparent emergent cause for patient's chest tightness and she is appropriate for discharge home with outpatient follow-up.  She was counseled to return to the ED for new or worsening symptoms, patient agrees with plan.      FINAL CLINICAL IMPRESSION(S) / ED DIAGNOSES   Final diagnoses:  Nonspecific chest pain     Rx / DC Orders   ED Discharge Orders     None        Note:  This document was prepared using Dragon voice recognition software and may include unintentional dictation errors.   Willo Dunnings, MD 05/24/24 [name]    Willo Dunnings, MD 05/24/24 2028

## 2024-05-24 NOTE — ED Provider Notes (Signed)
 CAY RALPH PELT    CSN: [id number] Arrival date & time: 05/24/24  1643      History   Chief Complaint Chief Complaint  Patient presents with   Chest Injury    Having some chest tightness - Entered by patient    HPI Karen [name] is a 33 y.o. female.  Patient presents with chest pain x 4 days.  The pain is midsternal which she describes as nonradiating tightness that started 4 days ago and was constant until yesterday evening when it subsided.  She woke up this morning without chest pain but it returned at 10 AM and has been constant since then.  No trauma.  She denies associated symptoms.  No shortness of breath, palpitations, numbness, weakness, dizziness.  She denies history of cardiovascular disease.  No OTC medications taken.  Non-smoker.  The history is provided by the patient and medical records.    Past Medical History:  Diagnosis Date   Benign gestational thrombocytopenia    Gallstones    Medical history non-contributory    SVD (spontaneous vaginal delivery) 11/04/2018    Patient Active Problem List   Diagnosis Date Noted   Term pregnancy 11/04/2018   SVD (spontaneous vaginal delivery) 11/04/2018   Postpartum care following vaginal delivery 11/04/2018   Pregnancy 10/14/2013    Past Surgical History:  Procedure Laterality Date   CHOLECYSTECTOMY N/A 05/07/2017   Procedure: LAPAROSCOPIC CHOLECYSTECTOMY;  Surgeon: Rubin Calamity, MD;  Location: [facility] OR;  Service: General;  Laterality: N/A;   INDUCED ABORTION     WISDOM TOOTH EXTRACTION      OB History     Gravida  3   Para  2   Term  1   Preterm  1   AB  1   Living  2      SAB  0   IAB  1   Ectopic  0   Multiple  0   Live Births  2            Home Medications    Prior to Admission medications   Medication Sig Start Date End Date Taking? Authorizing Provider  acetaminophen  (TYLENOL ) 325 MG tablet Take 2 tablets (650 mg total) by mouth every 4 (four) hours as needed (for  pain scale < 4). Patient not taking: Reported on 05/24/2024 11/05/18   Estelle Service, MD  ibuprofen  (ADVIL ,MOTRIN ) 600 MG tablet Take 1 tablet (600 mg total) by mouth every 6 (six) hours. Patient not taking: Reported on 05/24/2024 11/05/18   Estelle Service, MD  Prenatal Vit-Fe Fumarate-FA (PRENATAL MULTIVITAMIN) TABS tablet Take 1 tablet by mouth daily at 12 noon. Patient not taking: Reported on 05/24/2024    [provider]    Family History Family History  Problem Relation Age of Onset   Heart failure Mother     Social History Social History   Tobacco Use   Smoking status: Never   Smokeless tobacco: Never  Vaping Use   Vaping status: Never Used  Substance Use Topics   Alcohol use: No    Alcohol/week: 0.0 standard drinks of alcohol   Drug use: No     Allergies   Patient has no known allergies.   Review of Systems Review of Systems  Constitutional:  Negative for chills and fever.  Respiratory:  Negative for cough and shortness of breath.   Cardiovascular:  Positive for chest pain. Negative for palpitations and leg swelling.  Gastrointestinal:  Negative for nausea and vomiting.  Neurological:  Negative for dizziness, syncope, weakness, light-headedness and numbness.     Physical Exam Triage Vital Signs ED Triage Vitals  Encounter Vitals Group     BP 05/24/24 1803 133/85     Girls Systolic BP Percentile --      Girls Diastolic BP Percentile --      Boys Systolic BP Percentile --      Boys Diastolic BP Percentile --      Pulse Rate 05/24/24 1803 100     Resp 05/24/24 1803 18     Temp 05/24/24 1803 97.9 F (36.6 C)     Temp src --      SpO2 05/24/24 1803 98 %     Weight --      Height --      Head Circumference --      Peak Flow --      Pain Score 05/24/24 1807 8     Pain Loc --      Pain Education --      Exclude from Growth Chart --    No data found.  Updated Vital Signs BP 133/85   Pulse 100   Temp 97.9 F (36.6 C)   Resp 18   LMP  05/19/2024   SpO2 98%   Breastfeeding No   Visual Acuity Right Eye Distance:   Left Eye Distance:   Bilateral Distance:    Right Eye Near:   Left Eye Near:    Bilateral Near:     Physical Exam Constitutional:      General: She is not in acute distress. HENT:     Mouth/Throat:     Mouth: Mucous membranes are moist.  Cardiovascular:     Rate and Rhythm: Normal rate and regular rhythm.     Heart sounds: Normal heart sounds.  Pulmonary:     Effort: Pulmonary effort is normal. No respiratory distress.     Breath sounds: Normal breath sounds.  Musculoskeletal:     Right lower leg: No edema.     Left lower leg: No edema.  Neurological:     General: No focal deficit present.     Mental Status: She is alert.     Sensory: No sensory deficit.     Motor: No weakness.     Gait: Gait normal.      UC Treatments / Results  Labs (all labs ordered are listed, but only abnormal results are displayed) Labs Reviewed - No data to display  EKG   Radiology No results found.  Procedures Procedures (including critical care time)  Medications Ordered in UC Medications - No data to display  Initial Impression / Assessment and Plan / UC Course  I have reviewed the triage vital signs and the nursing notes.  Pertinent labs & imaging results that were available during my care of the patient were reviewed by me and considered in my medical decision making (see chart for details).   Chest pain.  Afebrile and vital signs are stable.  EKG shows sinus rhythm, rate 82, no ST elevation, inverted T wave in lead III, no previous to compare.  Discussed limitations of evaluation of chest pain in an urgent care setting.  Sending patient to the ED for evaluation.  She is agreeable to this and plans to go to [facility] ED.  She feels stable to drive herself there.  Final Clinical Impressions(s) / UC Diagnoses   Final diagnoses:  Chest pain, unspecified type     Discharge Instructions  Go  to the emergency department for evaluation of your chest pain.     ED Prescriptions   None    PDMP not reviewed this encounter.   Corlis Burnard DEL, NP 05/24/24 [phone]

## 2024-05-24 NOTE — ED Notes (Signed)
 Patient is being discharged from the Urgent Care and sent to the Emergency Department via POV . Per Burnard Cork, patient is in need of higher level of care due to chest pain. Patient is aware and verbalizes understanding of plan of care.  Vitals:   05/24/24 1803  BP: 133/85  Pulse: 100  Resp: 18  Temp: 97.9 F (36.6 C)  SpO2: 98%

## 2024-05-24 NOTE — ED Triage Notes (Signed)
 Pt reports chest tightness for the past few days, pt denies accompanying symptoms. Denies recent illness or any cardiac hx.

## 2024-07-13 ENCOUNTER — Ambulatory Visit

## 2024-09-17 ENCOUNTER — Ambulatory Visit
